# Patient Record
Sex: Female | Born: 1975 | Race: Black or African American | Hispanic: Yes | Marital: Single
Health system: Midwestern US, Community
[De-identification: ages and names within clinical notes are randomized; demographics above are authoritative.]

## PROBLEM LIST (undated history)

## (undated) DIAGNOSIS — N200 Calculus of kidney: Secondary | ICD-10-CM

## (undated) DIAGNOSIS — M48061 Spinal stenosis, lumbar region without neurogenic claudication: Secondary | ICD-10-CM

## (undated) DIAGNOSIS — M48062 Spinal stenosis, lumbar region with neurogenic claudication: Secondary | ICD-10-CM

## (undated) DIAGNOSIS — D251 Intramural leiomyoma of uterus: Secondary | ICD-10-CM

## (undated) DIAGNOSIS — N920 Excessive and frequent menstruation with regular cycle: Secondary | ICD-10-CM

## (undated) DIAGNOSIS — K219 Gastro-esophageal reflux disease without esophagitis: Secondary | ICD-10-CM

## (undated) DIAGNOSIS — E7800 Pure hypercholesterolemia, unspecified: Secondary | ICD-10-CM

## (undated) DIAGNOSIS — F418 Other specified anxiety disorders: Secondary | ICD-10-CM

## (undated) DIAGNOSIS — G4700 Insomnia, unspecified: Secondary | ICD-10-CM

## (undated) DIAGNOSIS — Z5189 Encounter for other specified aftercare: Secondary | ICD-10-CM

## (undated) DIAGNOSIS — J45909 Unspecified asthma, uncomplicated: Secondary | ICD-10-CM

## (undated) HISTORY — PX: BARIATRIC SURGERY: SHX1103

## (undated) HISTORY — DX: Gastro-esophageal reflux disease without esophagitis: K21.9

## (undated) HISTORY — PX: ABDOMINAL HYSTERECTOMY: SHX81

## (undated) HISTORY — PX: SPINE SURGERY: SHX786

## (undated) HISTORY — DX: Unspecified asthma, uncomplicated: J45.909

## (undated) HISTORY — DX: Other specified anxiety disorders: F41.8

## (undated) HISTORY — DX: Pure hypercholesterolemia, unspecified: E78.00

## (undated) HISTORY — DX: Encounter for other specified aftercare: Z51.89

## (undated) HISTORY — PX: HIP RESECTION ARTHROPLASTY: SHX1759

## (undated) HISTORY — DX: Insomnia, unspecified: G47.00

## (undated) HISTORY — PX: WISDOM TOOTH EXTRACTION: SHX21

## (undated) HISTORY — PX: TUBAL LIGATION: SHX77

---

## 2009-06-17 NOTE — Procedures (Signed)
Test Reason : PRE OP   Blood Pressure : ***/*** mmHG   Vent. Rate : 074 BPM     Atrial Rate : 074 BPM   P-R Int : 130 ms          QRS Dur : 080 ms   QT Int : 376 ms       P-R-T Axes : 032 070 041 degrees   QTc Int : 417 ms   Normal sinus rhythm   Nonspecific T wave abnormality   No previous ECGs available   Confirmed by Sherryll Burger, M.D., Sanjay ([id number]) on 06/18/2009 8:37:34 AM   Referred By:             Overread By: Delorise Shiner, M.D.

## 2009-06-17 NOTE — Procedures (Signed)
Test Reason : PRE OP   Blood Pressure : ***/*** mmHG   Vent. Rate : 074 BPM     Atrial Rate : 074 BPM   P-R Int : 130 ms          QRS Dur : 080 ms   QT Int : 376 ms       P-R-T Axes : 032 070 041 degrees   QTc Int : 417 ms   Normal sinus rhythm   Nonspecific T wave abnormality   No previous ECGs available   Confirmed by Shah, M.D., Sanjay ([id number]) on 06/18/2009 8:37:34 AM   Referred By:             Overread By: Sanjay Shah, M.D.

## 2009-06-19 NOTE — Op Note (Signed)
[facility]                                OPERATION REPORT                          SURGEON:  Arita Miss, M.D.   NAMEPennie Banter, Meriel Pica           SEX:          F   DATE:      06/19/2009                      DOB:          10-23-1976   MR#        [id number]                        ROOM:         [id number]   ACCT#      [id number]   cc:    Arita Miss, M.D.   PREOPERATIVE DIAGNOSIS   Disk herniations with stenosis, L4-5, L5-S1.   POSTOPERATIVE DIAGNOSIS   Disk herniations with stenosis, L4-5, L5-S1.   OPERATIONS PERFORMED   1. Lumbar laminectomy and decompression, L4-5, L5-S1.   2. Posterior spinal fusion, L4-5, L5-S1.   3. Use of Globus Revere pedicle screw instrumentation system, L4-5, L5-S1.   4. Transforaminal lumbar interbody fusion, L5-S1.   5. Use of Globus TLIF cage, size 10 mm x 26 mm, L5-S1.   6. Local autograft.   7. Allograft, nonstructural (INFUSE bone morphogenetic protein with       OssiMend allograft).   SURGEON   Lyna Poser, M.D.   ASSISTANT   Fredderick Phenix, PA-C   ANESTHESIA   General endotracheal   BLOOD LOSS   400 cc   FLUIDS RECEIVED   Crystalloid   DRAINS   One suction drain   COMPLICATIONS   None   HISTORY   This patient is a 33 year old female who presented to me with debilitating   back and lower extremity discomfort.  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  The surgical and   nonsurgical options were discussed with her.  The nonsurgical options   failed to alleviate her symptoms.  The decision was made to proceed with   surgery.   DESCRIPTION OF PROCEDURE   On the surgical date, the patient was identified properly in the SAU area.   The surgical procedure was further discussed with the patient and the   decision made to proceed.  IV antibiotics were administered.  The patient's   surgical site was marked appropriately.   Upon arrival in the operating room, a general anesthetic was performed.    The patient was placed prone on the operating table.  A Foley catheter was   used.  The Wilson frame was used.  The back was prepped and draped with   Betadine.   A longitudinal incision was made in the midline.  Soft tissues were   dissected down to the posterior aspect of the lumbar spine.  The deep   fascia was split longitudinally to reveal the posterolateral elements from   for L4-S1.   A lateral radiograph was taken to document our positioning.  Using a   combination of Leksell rongeurs,  curettes, pituitary rongeurs, etc., a full   central, lateral recess and foraminal decompression was performed.  This   was done from the pedicles of L4 down to and past the pedicles of S1.  No   dural tears were encountered.  Attention was then directed to the fusion.   The posterolateral elements at L4-5 and L5-S1 were fully decorticated.   This was done with a powered bur and a Leksell.  This was done along the   tips of the transverse processes at L4 bilaterally and L5 bilaterally as   well as along the sacral wings bilaterally.  In addition, the L4-5 and   L5-S1 facet joints bilaterally were decorticated with a Leksell and a   powered bur.   Attention was then directed to placement of pedicle screw instrumentation.   Using the Globus Revere system, 6.5 mm diameter screws were placed in L4   bilaterally and L5 bilaterally with a 7.5 mm diameter screws placed in the   sacrum bilaterally.  Biplane fluoroscopy was used and all screws deemed   appropriately placed.  This was done with a powered bur, a gearshift, a   handheld ball-tip probe, etc.  Attention was then directed to placement of   the TLIF cage.  On the patient's right side, distraction was obtained   across the interspace via distracting the L5-S1 pedicle screws.  A   rectangular annulotomy was performed.  The entirety of the nucleus pulposus   was removed with straight and upbiting pituitary rongeurs.  Curettes were    used to further denude the cartilaginous endplates at L5-S1.   Through this right posterolateral annulotomy, a 10 mm x 26 mm Globus TLIF   cage was placed and packed with INFUSE bone morphogenetic protein, etc.   Once it was positioned in place, the inserter device was removed and the   neurological elements irrigated.  No dural tears were encountered.   Two 65 mm rods were placed along with 6 locking caps.  The locking caps   were torqued shut.  Final films were obtained.   INFUSE bone morphogenetic protein and local autograft were placed in the   posterolateral gutters.  The wound was then closed in layers after a   suction drain was placed.  Staples were used for the skin.  A sterile   dressing was applied.   The patient was subsequently taken to the recovery room in stable condition   without complications.  All counts were correct.   ______________________________   Arita Miss, M.D.   Dictated By:   Seleta Rhymes  D:  06/19/2009  T:  06/20/2009  4:10 P  [id number]

## 2009-06-19 NOTE — Op Note (Signed)
[facility]                                OPERATION REPORT                          SURGEON:  Arita Miss, M.D.   NAMEPennie Harvey, Jillian Harvey           SEX:          F   DATE:      06/19/2009                      DOB:          Dec 03, 1976   MR#        [id number]                        ROOM:         [id number]   ACCT#      [id number]   cc:    Arita Miss, M.D.   PREOPERATIVE DIAGNOSIS   Disk herniations with stenosis, L4-5, L5-S1.   POSTOPERATIVE DIAGNOSIS   Disk herniations with stenosis, L4-5, L5-S1.   OPERATIONS PERFORMED   1. Lumbar laminectomy and decompression, L4-5, L5-S1.   2. Posterior spinal fusion, L4-5, L5-S1.   3. Use of Globus Revere pedicle screw instrumentation system, L4-5, L5-S1.   4. Transforaminal lumbar interbody fusion, L5-S1.   5. Use of Globus TLIF cage, size 10 mm x 26 mm, L5-S1.   6. Local autograft.   7. Allograft, nonstructural (INFUSE bone morphogenetic protein with       OssiMend allograft).   SURGEON   Lyna Poser, M.D.   ASSISTANT   Fredderick Phenix, PA-C   ANESTHESIA   General endotracheal   BLOOD LOSS   400 cc   FLUIDS RECEIVED   Crystalloid   DRAINS   One suction drain   COMPLICATIONS   None   HISTORY   This patient is a 33 year old female who presented to me with debilitating   back and lower extremity discomfort.  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  The surgical and   nonsurgical options were discussed with her.  The nonsurgical options   failed to alleviate her symptoms.  The decision was made to proceed with   surgery.   DESCRIPTION OF PROCEDURE   On the surgical date, the patient was identified properly in the SAU area.   The surgical procedure was further discussed with the patient and the   decision made to proceed.  IV antibiotics were administered.  The patient's   surgical site was marked appropriately.   Upon arrival in the operating room, a general anesthetic was performed.   The patient was placed prone on  the operating table.  A Foley catheter was   used.  The Wilson frame was used.  The back was prepped and draped with   Betadine.   A longitudinal incision was made in the midline.  Soft tissues were   dissected down to the posterior aspect of the lumbar spine.  The deep   fascia was split longitudinally to reveal the posterolateral elements from   for L4-S1.   A lateral radiograph was taken to document our positioning.  Using a   combination of Leksell rongeurs,  curettes, pituitary rongeurs, etc., a full   central, lateral recess and foraminal decompression was performed.  This   was done from the pedicles of L4 down to and past the pedicles of S1.  No   dural tears were encountered.  Attention was then directed to the fusion.   The posterolateral elements at L4-5 and L5-S1 were fully decorticated.   This was done with a powered bur and a Leksell.  This was done along the   tips of the transverse processes at L4 bilaterally and L5 bilaterally as   well as along the sacral wings bilaterally.  In addition, the L4-5 and   L5-S1 facet joints bilaterally were decorticated with a Leksell and a   powered bur.   Attention was then directed to placement of pedicle screw instrumentation.   Using the Globus Revere system, 6.5 mm diameter screws were placed in L4   bilaterally and L5 bilaterally with a 7.5 mm diameter screws placed in the   sacrum bilaterally.  Biplane fluoroscopy was used and all screws deemed   appropriately placed.  This was done with a powered bur, a gearshift, a   handheld ball-tip probe, etc.  Attention was then directed to placement of   the TLIF cage.  On the patient's right side, distraction was obtained   across the interspace via distracting the L5-S1 pedicle screws.  A   rectangular annulotomy was performed.  The entirety of the nucleus pulposus   was removed with straight and upbiting pituitary rongeurs.  Curettes were   used to further denude the cartilaginous endplates at L5-S1.   Through this  right posterolateral annulotomy, a 10 mm x 26 mm Globus TLIF   cage was placed and packed with INFUSE bone morphogenetic protein, etc.   Once it was positioned in place, the inserter device was removed and the   neurological elements irrigated.  No dural tears were encountered.   Two 65 mm rods were placed along with 6 locking caps.  The locking caps   were torqued shut.  Final films were obtained.   INFUSE bone morphogenetic protein and local autograft were placed in the   posterolateral gutters.  The wound was then closed in layers after a   suction drain was placed.  Staples were used for the skin.  A sterile   dressing was applied.   The patient was subsequently taken to the recovery room in stable condition   without complications.  All counts were correct.   ______________________________   Arita Miss, M.D.   Dictated By:   Seleta Rhymes  D:  06/19/2009  T:  06/20/2009  4:10 P  [id number]

## 2009-06-22 NOTE — Discharge Summary (Signed)
[facility]                                DISCHARGE SUMMARY                               DAVID Lenore Manner, M.D.   PRELIMINARY (DRAFT) -- FINAL REPORT  in [facility]   NAME:  TREJOS, Jillian Harvey             ADMIT DATE:       06/19/2009   MR#    [id number]                          [facility] DATE:       06/22/2009   ACCT#  [id number]                         SEX:              Ollen Barges, M.D.                       DOB:              1976/08/16   cc:    Arita Miss, M.D.   REASON FOR ADMISSION   Low back pain with sciatica.   PROCEDURE PERFORMED DURING ADMISSION   Lumbar laminectomy, decompression and fusion L4-S1.   HISTORY AND HOSPITAL COURSE   This patient is a 33 year old female who presented to me with debilitating   back and lower extremity discomfort. Imaging studies confirmed lumbar   stenosis at the L4-5 and L5-S1 levels.  Surgical and nonsurgical options   were discussed.  The patient agreed to proceed with surgery.   HOSPITAL COURSE   The patient was admitted through the same-day surgery program on the June 19, 2009.  The above procedure was performed without difficulty.  Over the   next 72 hours, the patient began to tolerate regular diet, oral analgesia,   and was able to ambulate with minimal assistance using a walker.   DISCHARGE INSTRUCTIONS   The patient will maintain routine wound care subsequent to discharge.  Home   PT and home RN services have been set up for her.  She will follow up with   me in 4 weeks.   __________________________   Arita Miss, M.D.   Dictated By:   [email or web address]  D:  10/08/2009  T:  10/10/2009 10:47 P   [id number]

## 2009-09-03 LAB — URINALYSIS W/ RFLX MICROSCOPIC
Bilirubin: NEGATIVE
Glucose: NEGATIVE MG/DL
Ketone: NEGATIVE MG/DL
Nitrites: POSITIVE — AB
Protein: NEGATIVE MG/DL
Specific gravity: >1.03 — ABNORMAL HIGH (ref 1.003–1.030)
Urobilinogen: 0.2 EU/DL (ref 0.2–1.0)
pH (UA): 5.5 (ref 5.0–8.0)

## 2009-09-03 LAB — URINE MICROSCOPIC ONLY
RBC: 0 /HPF (ref 0–5)
WBC: 11 /HPF (ref 0–4)

## 2009-09-03 LAB — WET PREP

## 2009-09-03 LAB — HCG URINE, QL: HCG urine, QL: NEGATIVE

## 2009-09-05 LAB — CHLAMYDIA/GC PCR
Chlamydia amplified: NEGATIVE
N. gonorrhea, amplified: NEGATIVE

## 2014-10-24 ENCOUNTER — Ambulatory Visit
Admit: 2014-10-24 | Discharge: 2014-10-24 | Payer: PRIVATE HEALTH INSURANCE | Attending: Orthopaedic Surgery | Primary: Case Management

## 2014-10-24 ENCOUNTER — Encounter: Admit: 2014-10-24 | Primary: Case Management

## 2014-10-24 ENCOUNTER — Encounter: Admit: 2014-10-24 | Discharge: 2014-10-24 | Payer: PRIVATE HEALTH INSURANCE | Primary: Case Management

## 2014-10-24 DIAGNOSIS — M25511 Pain in right shoulder: Secondary | ICD-10-CM

## 2014-10-24 MED ORDER — MELOXICAM 15 MG TAB
15 mg | ORAL_TABLET | Freq: Every day | ORAL | Status: DC
Start: 2014-10-24 — End: 2014-11-20

## 2014-10-24 NOTE — Progress Notes (Addendum)
Jillian Harvey  May 31, 1976   Chief Complaint   Patient presents with   ??? Shoulder Pain     RIGHT        HISTORY OF PRESENT ILLNESS  Jillian Harvey is a 38 y.o. female who presents today for evaluation of 38 year old female who presented today for evaluation of her right shoulder pain that started about three weeks ago.  She gave a history of an injury while she was lifting her luggage over her head while traveling and that she started to feel the pain over time and progressively get worse.  It does affect her sleep at night and overhead activities.  She has difficulty lifting her arm above her head.  She does take Motrin and Tramadol 50 mg for pain relief, which it does give relief for a few hours and then after which the pain starts again.  She does have pain at rest and at nighttime.  It is aching in nature and associated with radiation down to the mid-arm level and numbness over the forearm and the dorsum of the hand.  She denies any history of neck pain, elbow, or hand pain.  She has no previous history of pain in her shoulder, no physical therapy or previous injection to the shoulder.     .     Allergies   Allergen Reactions   ??? Pcn [Penicillins] Swelling     THROAT SWELLING        Past Medical History   Diagnosis Date   ??? Depression       History     Social History   ??? Marital Status: SINGLE     Spouse Name: N/A     Number of Children: N/A   ??? Years of Education: N/A     Occupational History   ??? Not on file.     Social History Main Topics   ??? Smoking status: Never Smoker    ??? Smokeless tobacco: Never Used   ??? Alcohol Use: No   ??? Drug Use: No   ??? Sexual Activity: Not on file     Other Topics Concern   ??? Not on file     Social History Narrative   ??? No narrative on file      Past Surgical History   Procedure Laterality Date   ??? Hx tubal ligation  1996   ??? Hx cesarean section  1991 1993   ??? Hx back surgery  2010     LS FUSION      History reviewed. No pertinent family history.    Current Outpatient Prescriptions   Medication Sig   ??? ABILIFY 10 mg tablet    ??? buPROPion XL (WELLBUTRIN XL) 150 mg tablet    ??? escitalopram oxalate (LEXAPRO) 20 mg tablet    ??? traMADol (ULTRAM) 50 mg tablet    ??? meloxicam (MOBIC) 15 mg tablet Take 1 Tab by mouth daily for 30 days. TAKE WITH FOOD     No current facility-administered medications for this visit.       REVIEW OF SYSTEM   Review of Systems   Constitutional: Negative.    HENT: Negative.    Eyes: Negative.    Respiratory: Negative.    Cardiovascular: Negative.    Gastrointestinal: Negative.    Musculoskeletal: Positive for myalgias and joint pain. Negative for back pain, falls and neck pain.   Skin: Negative.    Neurological: Positive for tingling (right forarem and hand ).   Psychiatric/Behavioral: Negative.  PHYSICAL EXAM:   BP 133/75 mmHg   Pulse 113   Ht 5\' 4"  (1.626 m)   Wt 217 lb (98.431 kg)   BMI 37.23 kg/m2   LMP 10/04/2014  Physical Exam   Constitutional: She is oriented to person, place, and time and well-developed, well-nourished, and in no distress.   Neck: Normal range of motion.   Musculoskeletal: She exhibits edema and tenderness.        Right shoulder: She exhibits decreased range of motion, tenderness, bony tenderness (AC joint and subacromian area), swelling, pain and decreased strength. She exhibits no effusion, no crepitus, no deformity, no laceration and normal pulse.   impengment sign is positive, lift off test creat discomfort, empty can test is pain full    Neurological: She is alert and oriented to person, place, and time. Gait normal.   Skin: Skin is warm and dry.   Vitals reviewed.        RADIOGRAPHS/X-RAYS:   Ac arthritis mild to moderate maintain subacromian space, normal glenohumeral joint       PLAN:   1. Shoulder pain, acute, right  I diagnosed this patient with right shoulder bursitis with tendinitis involving the subacromial bursa and impingement secondary to the bursitis.   I did educate the patient about the diagnosis.  We are going to start with conservative management that will involve anti-inflammatory course with physical therapy and then we will re-evaluate the patient in the future if she is not improving.  I did explain to the patient that it is very unlikely that there is a tear in the tendon at this point given the clinical examination.  However, if the pain is not improved over the next six to eight weeks, I think she will need to be re-evaluated again for consideration of having an MRI to rule out any tear of the tendon, specifically, the supraspinatus tendon.        - ABILIFY 10 mg tablet; ; Refill: 2  - buPROPion XL (WELLBUTRIN XL) 150 mg tablet; ; Refill: 2  - escitalopram oxalate (LEXAPRO) 20 mg tablet; ; Refill: 2  - traMADol (ULTRAM) 50 mg tablet; ; Refill: 0  - XR SHOULDER RT AP/LAT MIN 2 V; Future  - meloxicam (MOBIC) 15 mg tablet; Take 1 Tab by mouth daily for 30 days. TAKE WITH FOOD  Dispense: 1 Tab; Refill: 0    2. Bursitis, shoulder, right    - ABILIFY 10 mg tablet; ; Refill: 2  - buPROPion XL (WELLBUTRIN XL) 150 mg tablet; ; Refill: 2  - escitalopram oxalate (LEXAPRO) 20 mg tablet; ; Refill: 2  - traMADol (ULTRAM) 50 mg tablet; ; Refill: 0  - XR SHOULDER RT AP/LAT MIN 2 V; Future  - meloxicam (MOBIC) 15 mg tablet; Take 1 Tab by mouth daily for 30 days. TAKE WITH FOOD  Dispense: 1 Tab; Refill: 0    I have discussed the diagnosis with the patient and the intended plan as seen in the above orders. The patient has received an after-visit summary and questions were answered concerning future plans. I have Explained the treatment plan or any medication side effects with the patient. I have reviewed the plan of care with the patient, accepted their input and they are in agreement with the treatment goals.       Follow-up Disposition: Not on File      Glendale Chard, MD   [facility]

## 2014-10-24 NOTE — Patient Instructions (Addendum)
Shoulder Bursitis: After Your Visit  Your Care Instructions     Bursitis is inflammation of the bursa. A bursa is a small sac of fluid that cushions a joint and helps it move easily. A bursa sits under the highest point of your shoulder. You can get bursitis by overusing your shoulder, which can happen with activities such as lifting, pitching a ball, or painting. Symptoms of bursitis include pain when you move your arm. Your arm may hurt when you try to lift it, and the pain can reach down the side of your arm. You may have trouble sleeping because of the pain.  Bursitis usually gets better if you avoid the activity that caused it. If pain lasts or gets worse despite home treatment, your doctor may draw fluid from the bursa through a needle. This may relieve your pain and help your doctor know if you have an infection. If so, your doctor will prescribe antibiotics. If you have inflammation only, you may get a corticosteroid shot to reduce swelling and pain. Sometimes surgery is needed to drain or remove the bursa.  Follow-up care is a key part of your treatment and safety. Be sure to make and go to all appointments, and call your doctor if you are having problems. It???s also a good idea to know your test results and keep a list of the medicines you take.  How can you care for yourself at home?  ?? Put ice or a cold pack on your shoulder for 10 to 20 minutes at a time. Put a thin cloth between the ice and your skin.  ?? After 3 days of using ice, use heat on your shoulder. You can use a hot water bottle, a heating pad set on low, or a warm, moist towel. Some doctors suggest alternating between hot and cold.  ?? Rest your shoulder. Stop any activities that cause pain. Switch to activities that do not stress your shoulder.  ?? Take your medicines exactly as prescribed. Call your doctor if you think you are having a problem with your medicine.  ?? If your doctor recommends it, take anti-inflammatory medicines to reduce  pain. These include ibuprofen (Advil, Motrin) and naproxen (Aleve). Read and follow all instructions on the label.  ?? To prevent stiffness, gently move the shoulder joint through its full range of motion. As the pain gets better, keep doing range-of-motion exercises. Ask your doctor for exercises that will make the muscles around the shoulder joint stronger. Do these as directed.  ?? You can slowly return to the activity that caused the pain, but do it with less effort until you can do it without pain or swelling. Be sure to warm up before and stretch after you do the activity.  When should you call for help?  Call your doctor now or seek immediate medical care if:  ?? You have a fever.  ?? You have increased swelling or redness in your shoulder.  ?? You cannot use your shoulder, or the pain in your shoulder gets worse.  Watch closely for changes in your health, and be sure to contact your doctor if:  ?? You have pain for 2 weeks or longer despite home treatment.   Where can you learn more?   Go to http://www.healthwise.net/[facility]  Enter M955 in the search box to learn more about "Shoulder Bursitis: After Your Visit."   ?? 2006-2015 Healthwise, Incorporated. Care instructions adapted under license by [place] (which disclaims liability or warranty for this information).   This care instruction is for use with your licensed healthcare professional. If you have questions about a medical condition or this instruction, always ask your healthcare professional. [place] any warranty or liability for your use of this information.  Content Version: 10.5.422740; Current as of: November 14, 201          ATTEND PHYSICAL THERAPY AS INSTRUCTED  RETURN IN 4 WEEKS FOR RE EVALUATION  DO NOT TAKE MOBIC WITH OTHER NSAIDS    Nonsteroidal Anti-Inflammatory Drugs (NSAIDs): After Your Visit  Your Care Instructions  Nonsteroidal anti-inflammatory drugs (NSAIDs) relieve pain and fever. You  also can use them to reduce swelling and inflammation.  Over-the-counter NSAIDs include:  ?? Ibuprofen (Advil, Motrin).  ?? Naproxen (Aleve).  Aspirin (Bayer, Bufferin) is also an NSAID. But it doesn't work the same way as these other NSAIDs.  Prescription NSAIDs include:  ?? Diclofenac (Voltaren).  ?? Indomethacin (Indocin).  ?? Piroxicam (Feldene).  Take NSAIDS exactly as prescribed. Call your doctor if you think you are having a problem with your medicine. If you are taking over-the-counter medicine, read and follow all instructions on the label.  Follow-up care is a key part of your treatment and safety. Be sure to make and go to all appointments, and call your doctor if you are having problems. It's also a good idea to know your test results and keep a list of the medicines you take.  What should you know about NSAIDs?  ?? Do not use an over-the-counter NSAID for longer than 10 days. Talk to your doctor first.  ?? The most common side effects from NSAIDs are stomachaches, heartburn, and nausea. Taking NSAIDs with food may help prevent these problems.  ?? Using NSAIDs may:  ?? Lead to stomach ulcers or high blood pressure.  ?? Make symptoms of heart failure worse.  ?? Raise the risk of heart attack, stroke, kidney damage, skin reactions, and bleeding in the stomach and intestines.  ?? Your risks are greater if you take NSAIDs at higher doses or for longer than the instructions say. People who are older than 25 or who have heart, stomach, or intestinal disease have a higher risk for problems.  Aspirin  Aspirin is not like other NSAIDs. It can reduce the risk of heart attack and stroke. But it also raises the risks of kidney damage, skin reactions, and bleeding in the stomach and intestines.  ?? If you use other NSAIDs a lot, aspirin may not work as well to prevent heart attack and stroke.  ?? If you take aspirin every day for your heart, talk with your doctor before you take other NSAIDs.   ?? Do not give aspirin to anyone younger than 20. It has been linked to Reye syndrome, a serious illness.  When should you call for help?  Call 911 anytime you think you may need emergency care. For example, call if:  ?? You passed out (lost consciousness).  ?? You vomit blood or what looks like coffee grounds.  ?? You pass maroon or very bloody stools.  Call your doctor now or seek immediate medical care if:  ?? Your stools are black and tarlike or have streaks of blood.  Watch closely for changes in your health, and be sure to contact your doctor if you have any problems.   Where can you learn more?   Go to [employer]  Enter A328 in the search box to learn more about "Nonsteroidal Anti-Inflammatory Drugs (NSAIDs): After Your Visit."   ??  2006-2015 Healthwise, Incorporated. Care instructions adapted under license by [place] (which disclaims liability or warranty for this information). This care instruction is for use with your licensed healthcare professional. If you have questions about a medical condition or this instruction, always ask your healthcare professional. Healthwise, Incorporated disclaims any warranty or liability for your use of this information.  Content Version: 10.5.422740; Current as of: February 16, 2014

## 2014-10-24 NOTE — Progress Notes (Signed)
RIGHT SHOULDER PAIN AFTER LIFTING A 46 POUND BAG OF LUGGAGE. SOME NUMBNESS IN HAND, NO NECK PAIN, NO XRAYS, NO SPECIALIST TREATMENT.   DIFFICULTY WITH LIFTING ARM ABOVE HEAD AND BEHIND BACK

## 2014-10-24 NOTE — Telephone Encounter (Signed)
error 

## 2014-10-30 NOTE — Progress Notes (Signed)
Xray only.

## 2014-11-20 MED ORDER — MELOXICAM 15 MG TAB
15 mg | ORAL_TABLET | ORAL | Status: DC
Start: 2014-11-20 — End: 2018-03-24

## 2018-03-03 ENCOUNTER — Inpatient Hospital Stay: Admit: 2018-03-03 | Payer: MEDICAID | Primary: Case Management

## 2018-03-03 DIAGNOSIS — D649 Anemia, unspecified: Secondary | ICD-10-CM

## 2018-03-03 MED ORDER — FERRIC CARBOXYMALTOSE 50 MG IRON/ML INTRAVENOUS SOLUTION
50 mg iron/mL | Freq: Once | INTRAVENOUS | Status: AC
Start: 2018-03-03 — End: 2018-03-03
  Administered 2018-03-03: 20:00:00 via INTRAVENOUS

## 2018-03-03 MED ORDER — SODIUM CHLORIDE 0.9 % IJ SYRG
INTRAMUSCULAR | Status: DC | PRN
Start: 2018-03-03 — End: 2018-03-07
  Administered 2018-03-03: 20:00:00 via INTRAVENOUS

## 2018-03-03 MED ORDER — SODIUM CHLORIDE 0.9 % IV
Freq: Once | INTRAVENOUS | Status: AC
Start: 2018-03-03 — End: 2018-03-03
  Administered 2018-03-03: 20:00:00 via INTRAVENOUS

## 2018-03-03 MED FILL — INJECTAFER 50 MG IRON/ML INTRAVENOUS SOLUTION: 50 mg iron/mL | INTRAVENOUS | Qty: 15

## 2018-03-03 MED FILL — SODIUM CHLORIDE 0.9 % IV: INTRAVENOUS | Qty: 1000

## 2018-03-03 MED FILL — BD POSIFLUSH NORMAL SALINE 0.9 % INJECTION SYRINGE: INTRAMUSCULAR | Qty: 40

## 2018-03-03 NOTE — Progress Notes (Signed)
[facility] OPIC Progress Note    Date: March 03, 2018    Name: Cristol Engdahl    MRN: [id number]         DOB: 07/15/1976    [facility]    Ms. Colon-Estrada was assessed and education was provided.     Ms. Colon-Estrada's vitals were reviewed and patient was observed for 5 minutes prior to treatment.   Visit Vitals  BP 115/71 (BP 1 Location: Left arm, BP Patient Position: Sitting)   Pulse 84   Temp 97 ??F (36.1 ??C)   Resp 18   SpO2 99%       Lab results were obtained and reviewed.  No results found for this or any previous visit (from the past 12 hour(s)).    Saline lock started to right antecubital vein x1 attempt using 22g catheter, line flushes briskly.     Normal Saline initiated at 25 ml/hr     Injectafer 750 mg was given IVP over 8 min.    Ms. Consuegra was observed for 30 min post infusion. No s/s of reaction occurred during this time period.    Saline lock removed. Gauze and coban applied to site.    Ms. Stampley tolerated the infusion, and had no complaints.  Patient armband removed and shredded.    Ms. Jasso was discharged from [facility] in stable condition at 1540. She is to return on 03/03/18 at 1500 for her next appointment.    Roque Lias, RN  March 03, 2018  4:34 PM

## 2018-03-10 ENCOUNTER — Inpatient Hospital Stay: Admit: 2018-03-10 | Payer: MEDICAID | Primary: Case Management

## 2018-03-10 MED ORDER — FERRIC CARBOXYMALTOSE 50 MG IRON/ML INTRAVENOUS SOLUTION
50 mg iron/mL | Freq: Once | INTRAVENOUS | Status: AC
Start: 2018-03-10 — End: 2018-03-10
  Administered 2018-03-10: 19:00:00 via INTRAVENOUS

## 2018-03-10 MED FILL — INJECTAFER 50 MG IRON/ML INTRAVENOUS SOLUTION: 50 mg iron/mL | INTRAVENOUS | Qty: 15

## 2018-03-10 NOTE — Progress Notes (Addendum)
[facility] OPIC Progress Note    Date: March 10, 2018    Name: Camdyn Laden    MRN: [id number]         DOB: [birth year]    [facility]    Ms. Colon-Estrada was assessed and education was provided.     Ms. Colon-Estrada's vitals were reviewed and patient was observed for 5 minutes prior to treatment.   Visit Vitals  BP 121/81 (BP 1 Location: Left arm, BP Patient Position: Sitting)   Pulse 79   Temp 98.3 ??F (36.8 ??C)   Resp 18   SpO2 99%       Saline lock started to right antecubital vein x1 attempt using 22g catheter, brisk blood return; line flushes well.     Injectafer 750 mg was given IVP over 13 min.    No s/s of reaction occurred during this time period.    Saline lock removed. Gauze and coban applied to site.    Ms. Doubrava tolerated the infusion, and had no complaints.  Patient armband removed and shredded.    Ms. Hunke was discharged from [facility] in stable condition at 1455. She is to followup with referring provider.    Meliton Rattan, RN  March 10, 2018  1455

## 2018-03-21 NOTE — Addendum Note (Signed)
Addended by: Maralyn Sago TED on: 03/23/2018 08:36 AM     Modules accepted: Orders

## 2018-03-24 ENCOUNTER — Encounter

## 2018-03-24 ENCOUNTER — Inpatient Hospital Stay: Admit: 2018-03-24 | Payer: MEDICAID | Primary: Case Management

## 2018-03-24 DIAGNOSIS — Z01812 Encounter for preprocedural laboratory examination: Secondary | ICD-10-CM

## 2018-03-24 LAB — CBC W/O DIFF
HCT: 38.1 % (ref 35.0–45.0)
HGB: 12 g/dL (ref 12.0–16.0)
MCH: 26.8 PG (ref 24.0–34.0)
MCHC: 31.5 g/dL (ref 31.0–37.0)
MCV: 85 FL (ref 74.0–97.0)
MPV: 8.7 FL — ABNORMAL LOW (ref 9.2–11.8)
PLATELET: 389 10*3/uL (ref 135–420)
RBC: 4.48 M/uL (ref 4.20–5.30)
RDW: 22.5 % — ABNORMAL HIGH (ref 11.6–14.5)
WBC: 9.6 10*3/uL (ref 4.6–13.2)

## 2018-03-24 LAB — BETA HCG, QT
Beta HCG, QT: <1 m[IU]/mL (ref 0–10)
hCG Quant: <1 m[IU]/mL (ref 0–10)

## 2018-03-24 NOTE — Other (Signed)
PAT - SURGICAL PRE-ADMISSION INSTRUCTIONS    NAME:  Jillian Harvey                                                          TODAY'S DATE:  03/24/2018    SURGERY DATE:  03/28/2018                                  SURGERY ARRIVAL TIME:   TBA    1. Do NOT eat or drink anything, including candy or gum, after MIDNIGHT on 03/27/18 , unless you have specific instructions from your Surgeon or Anesthesia Provider to do so.  2. No smoking on the day of surgery.  3. No alcohol 24 hours prior to the day of surgery.  4. No recreational drugs for one week prior to the day of surgery.  5. Leave all valuables, including money/purse, at home.  6. Remove all jewelry, nail polish, makeup (including mascara); no lotions, powders, deodorant, or perfume/cologne/after shave.  7. Glasses/Contact lenses and Dentures may be worn to the hospital.  They will be removed prior to surgery.  8. Call your doctor if symptoms of a cold or illness develop within 24 ours prior to surgery.  9. AN ADULT MUST DRIVE YOU HOME AFTER OUTPATIENT SURGERY.   10. If you are having an OUTPATIENT procedure, please make arrangements for a responsible adult to be with you for 24 hours after your surgery.  11. If you are admitted to the hospital, you will be assigned to a bed after surgery is complete.  Normally a family member will not be able to see you until you are in your assigned bed.  54. Family is encouraged to accompany you to the hospital.  We ask visitors in the treatment area to be limited to ONE person at a time to ensure patient privacy.  EXCEPTIONS WILL BE MADE AS NEEDED.  62. Children under 12 are discouraged from entering the treatment area and need to be supervised by an adult when in the waiting room.    Special Instructions:    Bring inhaler., Take these medications the morning of surgery with a sip of water:  [place] IF DESIRED    Patient Prep:    shower with anti-bacterial soap     These surgical instructions were reviewed with PATIENT during the PAT PHONE CALL.     Directions:  On the morning of surgery, please go to the [facility].  Enter the building from the [employer] lot entrance, 1st floor (next to the Emergency Room entrance).  Take the elevator to the 2nd floor.  Sign in at the Registration Desk.    If you have any questions and/or concerns, please do not hesitate to call:  (Prior to the day of surgery)  [facility] unit:  [phone]  (Day of surgery)  [facility] unit:  [phone]

## 2018-03-24 NOTE — Unmapped (Signed)
Formatting of this note might be different from the original.  PAT - SURGICAL PRE-ADMISSION INSTRUCTIONS    NAME:  Jillian Harvey                                                          TODAY'S DATE:  03/24/2018    SURGERY DATE:  03/28/2018                                  SURGERY ARRIVAL TIME:   TBA    1. Do NOT eat or drink anything, including candy or gum, after MIDNIGHT on 03/27/18 , unless you have specific instructions from your Surgeon or Anesthesia Provider to do so.  2. No smoking on the day of surgery.  3. No alcohol 24 hours prior to the day of surgery.  4. No recreational drugs for one week prior to the day of surgery.  5. Leave all valuables, including money/purse, at home.  6. Remove all jewelry, nail polish, makeup (including mascara); no lotions, powders, deodorant, or perfume/cologne/after shave.  7. Glasses/Contact lenses and Dentures may be worn to the hospital.  They will be removed prior to surgery.  8. Call your doctor if symptoms of a cold or illness develop within 24 ours prior to surgery.  9. AN ADULT MUST DRIVE YOU HOME AFTER OUTPATIENT SURGERY.   10. If you are having an OUTPATIENT procedure, please make arrangements for a responsible adult to be with you for 24 hours after your surgery.  11. If you are admitted to the hospital, you will be assigned to a bed after surgery is complete.  Normally a family member will not be able to see you until you are in your assigned bed.  57. Family is encouraged to accompany you to the hospital.  We ask visitors in the treatment area to be limited to ONE person at a time to ensure patient privacy.  EXCEPTIONS WILL BE MADE AS NEEDED.  40. Children under 12 are discouraged from entering the treatment area and need to be supervised by an adult when in the waiting room.    Special Instructions:    Bring inhaler., Take these medications the morning of surgery with a sip of water:  [place] IF DESIRED    Patient Prep:    shower with  anti-bacterial soap    These surgical instructions were reviewed with PATIENT during the PAT PHONE CALL.     Directions:  On the morning of surgery, please go to the [facility].  Enter the building from the [employer] lot entrance, 1st floor (next to the Emergency Room entrance).  Take the elevator to the 2nd floor.  Sign in at the Registration Desk.    If you have any questions and/or concerns, please do not hesitate to call:  (Prior to the day of surgery)  [facility] unit:  [phone]  (Day of surgery)  [facility] unit:  [phone]  Electronically signed by Westley Hummer, RN at 03/24/2018 11:19 AM EDT

## 2018-03-25 LAB — TYPE & SCREEN
ABO/Rh(D): O NEG
Antibody screen: NEGATIVE

## 2018-03-25 LAB — TYPE AND SCREEN
ABO/Rh: O NEG
Antibody Screen: NEGATIVE

## 2018-03-28 ENCOUNTER — Inpatient Hospital Stay: Payer: MEDICAID

## 2018-03-28 LAB — HCG URINE, QL. - POC: Pregnancy test,urine (POC): NEGATIVE

## 2018-03-28 MED ORDER — LACTATED RINGERS IV
INTRAVENOUS | Status: AC
Start: 2018-03-28 — End: 2018-03-29
  Administered 2018-03-28: 11:00:00 via INTRAVENOUS

## 2018-03-28 MED ORDER — INSULIN LISPRO 100 UNIT/ML INJECTION
100 unit/mL | Freq: Once | SUBCUTANEOUS | Status: AC
Start: 2018-03-28 — End: 2018-03-28

## 2018-03-28 MED ORDER — SODIUM CHLORIDE 0.9 % IJ SYRG
Freq: Three times a day (TID) | INTRAMUSCULAR | Status: DC
Start: 2018-03-28 — End: 2018-03-29
  Administered 2018-03-28: 18:00:00 via INTRAVENOUS

## 2018-03-28 MED ORDER — LACTATED RINGERS IV
INTRAVENOUS | Status: DC
Start: 2018-03-28 — End: 2018-03-28

## 2018-03-28 MED ORDER — VECURONIUM BROMIDE 10 MG IV SOLR
10 mg | INTRAVENOUS | Status: AC
Start: 2018-03-28 — End: ?

## 2018-03-28 MED ORDER — VECURONIUM BROMIDE 10 MG IV SOLR
10 mg | INTRAVENOUS | Status: DC | PRN
Start: 2018-03-28 — End: 2018-03-28
  Administered 2018-03-28 (×3): via INTRAVENOUS

## 2018-03-28 MED ORDER — LIDOCAINE (PF) 20 MG/ML (2 %) IJ SOLN
20 mg/mL (2 %) | INTRAMUSCULAR | Status: AC
Start: 2018-03-28 — End: ?

## 2018-03-28 MED ORDER — HYDROCODONE-ACETAMINOPHEN 5 MG-325 MG TAB
5-325 mg | ORAL | Status: DC | PRN
Start: 2018-03-28 — End: 2018-03-29
  Administered 2018-03-28 – 2018-03-29 (×2): via ORAL

## 2018-03-28 MED ORDER — ACETAMINOPHEN 325 MG TABLET
325 mg | ORAL | Status: DC | PRN
Start: 2018-03-28 — End: 2018-03-29
  Administered 2018-03-29: 04:00:00 via ORAL

## 2018-03-28 MED ORDER — LEVOFLOXACIN IN D5W 500 MG/100 ML IV PIGGY BACK
500 mg/100 mL | Freq: Once | INTRAVENOUS | Status: AC
Start: 2018-03-28 — End: 2018-03-28

## 2018-03-28 MED ORDER — INSULIN LISPRO 100 UNIT/ML INJECTION
100 unit/mL | Freq: Four times a day (QID) | SUBCUTANEOUS | Status: DC
Start: 2018-03-28 — End: 2018-03-28

## 2018-03-28 MED ORDER — SODIUM CHLORIDE 0.9 % IJ SYRG
INTRAMUSCULAR | Status: DC | PRN
Start: 2018-03-28 — End: 2018-03-29

## 2018-03-28 MED ORDER — WATER FOR INJECTION, STERILE INJECTION
INTRAMUSCULAR | Status: AC
Start: 2018-03-28 — End: ?

## 2018-03-28 MED ORDER — ZOLPIDEM 5 MG TAB
5 mg | Freq: Every evening | ORAL | Status: DC | PRN
Start: 2018-03-28 — End: 2018-03-29

## 2018-03-28 MED ORDER — LACTATED RINGERS IV
INTRAVENOUS | Status: DC
Start: 2018-03-28 — End: 2018-03-29
  Administered 2018-03-28 – 2018-03-29 (×2): via INTRAVENOUS

## 2018-03-28 MED ORDER — SODIUM CHLORIDE 0.9 % IV
40 mg/mL | INTRAVENOUS | Status: DC | PRN
Start: 2018-03-28 — End: 2018-03-28
  Administered 2018-03-28: 13:00:00

## 2018-03-28 MED ORDER — DIPHENHYDRAMINE HCL 50 MG/ML IJ SOLN
50 mg/mL | INTRAMUSCULAR | Status: DC | PRN
Start: 2018-03-28 — End: 2018-03-28

## 2018-03-28 MED ORDER — OXYCODONE 5 MG TAB
5 mg | Freq: Once | ORAL | Status: AC | PRN
Start: 2018-03-28 — End: 2018-03-28
  Administered 2018-03-28: 16:00:00 via ORAL

## 2018-03-28 MED ORDER — SODIUM CHLORIDE 0.9 % IV
INTRAVENOUS | Status: DC | PRN
Start: 2018-03-28 — End: 2018-03-28
  Administered 2018-03-28: 13:00:00

## 2018-03-28 MED ORDER — ONDANSETRON (PF) 4 MG/2 ML INJECTION
4 mg/2 mL | Freq: Once | INTRAMUSCULAR | Status: DC
Start: 2018-03-28 — End: 2018-03-28

## 2018-03-28 MED ORDER — BUPIVACAINE-EPINEPHRINE (PF) 0.5 %-1:200,000 IJ SOLN
0.5 %-1:200,000 | INTRAMUSCULAR | Status: AC
Start: 2018-03-28 — End: ?

## 2018-03-28 MED ORDER — PROPOFOL 10 MG/ML IV EMUL
10 mg/mL | INTRAVENOUS | Status: AC
Start: 2018-03-28 — End: ?

## 2018-03-28 MED ORDER — FENTANYL CITRATE (PF) 50 MCG/ML IJ SOLN
50 mcg/mL | INTRAMUSCULAR | Status: DC | PRN
Start: 2018-03-28 — End: 2018-03-28
  Administered 2018-03-28 (×6): via INTRAVENOUS

## 2018-03-28 MED ORDER — MIDAZOLAM 1 MG/ML IJ SOLN
1 mg/mL | INTRAMUSCULAR | Status: DC | PRN
Start: 2018-03-28 — End: 2018-03-28
  Administered 2018-03-28: 12:00:00 via INTRAVENOUS

## 2018-03-28 MED ORDER — FENTANYL CITRATE (PF) 50 MCG/ML IJ SOLN
50 mcg/mL | INTRAMUSCULAR | Status: DC | PRN
Start: 2018-03-28 — End: 2018-03-28
  Administered 2018-03-28: 15:00:00 via INTRAVENOUS

## 2018-03-28 MED ORDER — PROPOFOL 10 MG/ML IV EMUL
10 mg/mL | INTRAVENOUS | Status: DC | PRN
Start: 2018-03-28 — End: 2018-03-28
  Administered 2018-03-28: 12:00:00 via INTRAVENOUS

## 2018-03-28 MED ORDER — FENTANYL CITRATE (PF) 50 MCG/ML IJ SOLN
50 mcg/mL | INTRAMUSCULAR | Status: AC
Start: 2018-03-28 — End: ?

## 2018-03-28 MED ORDER — NEOSTIGMINE METHYLSULFATE 3 MG/3 ML (1 MG/ML) IV SYRINGE
3 mg/ mL (1 mg/mL) | INTRAVENOUS | Status: AC
Start: 2018-03-28 — End: ?

## 2018-03-28 MED ORDER — GLYCOPYRROLATE 0.2 MG/ML IJ SOLN
0.2 mg/mL | INTRAMUSCULAR | Status: DC | PRN
Start: 2018-03-28 — End: 2018-03-28
  Administered 2018-03-28: 15:00:00 via INTRAVENOUS

## 2018-03-28 MED ORDER — LIDOCAINE (PF) 20 MG/ML (2 %) IJ SOLN
20 mg/mL (2 %) | INTRAMUSCULAR | Status: DC | PRN
Start: 2018-03-28 — End: 2018-03-28
  Administered 2018-03-28: 12:00:00 via INTRAVENOUS

## 2018-03-28 MED ORDER — CLINDAMYCIN IN D5W 900 MG/50 ML IV PIGGY BACK
900 mg/50 mL | INTRAVENOUS | Status: DC | PRN
Start: 2018-03-28 — End: 2018-03-28
  Administered 2018-03-28: 12:00:00 via INTRAVENOUS

## 2018-03-28 MED ORDER — DEXAMETHASONE SODIUM PHOSPHATE 4 MG/ML IJ SOLN
4 mg/mL | INTRAMUSCULAR | Status: DC | PRN
Start: 2018-03-28 — End: 2018-03-28
  Administered 2018-03-28: 12:00:00 via INTRAVENOUS

## 2018-03-28 MED ORDER — DOCUSATE SODIUM 100 MG CAP
100 mg | Freq: Two times a day (BID) | ORAL | Status: DC | PRN
Start: 2018-03-28 — End: 2018-03-29

## 2018-03-28 MED ORDER — GENTAMICIN 40 MG/ML IJ SOLN
40 mg/mL | INTRAMUSCULAR | Status: AC
Start: 2018-03-28 — End: ?

## 2018-03-28 MED ORDER — GLYCOPYRROLATE 0.2 MG/ML IJ SOLN
0.2 mg/mL | INTRAMUSCULAR | Status: AC
Start: 2018-03-28 — End: ?

## 2018-03-28 MED ORDER — ONDANSETRON (PF) 4 MG/2 ML INJECTION
4 mg/2 mL | INTRAMUSCULAR | Status: DC | PRN
Start: 2018-03-28 — End: 2018-03-28
  Administered 2018-03-28: 14:00:00 via INTRAVENOUS

## 2018-03-28 MED ORDER — DEXAMETHASONE SODIUM PHOSPHATE 4 MG/ML IJ SOLN
4 mg/mL | INTRAMUSCULAR | Status: AC
Start: 2018-03-28 — End: ?

## 2018-03-28 MED ORDER — NALOXONE 0.4 MG/ML INJECTION
0.4 mg/mL | INTRAMUSCULAR | Status: DC | PRN
Start: 2018-03-28 — End: 2018-03-28

## 2018-03-28 MED ORDER — SODIUM CHLORIDE 0.9 % IJ SYRG
INTRAMUSCULAR | Status: DC | PRN
Start: 2018-03-28 — End: 2018-03-28

## 2018-03-28 MED ORDER — DIPHENHYDRAMINE HCL 50 MG/ML IJ SOLN
50 mg/mL | Freq: Four times a day (QID) | INTRAMUSCULAR | Status: DC | PRN
Start: 2018-03-28 — End: 2018-03-29

## 2018-03-28 MED ORDER — BUPIVACAINE-EPINEPHRINE (PF) 0.5 %-1:200,000 IJ SOLN
0.5 %-1:200,000 | INTRAMUSCULAR | Status: DC | PRN
Start: 2018-03-28 — End: 2018-03-28
  Administered 2018-03-28 (×2): via SUBCUTANEOUS

## 2018-03-28 MED ORDER — MIDAZOLAM 1 MG/ML IJ SOLN
1 mg/mL | INTRAMUSCULAR | Status: AC
Start: 2018-03-28 — End: ?

## 2018-03-28 MED ORDER — CLINDAMYCIN IN D5W 900 MG/50 ML IV PIGGY BACK
900 mg/50 mL | Freq: Once | INTRAVENOUS | Status: AC
Start: 2018-03-28 — End: 2018-03-28
  Administered 2018-03-28: 11:00:00 via INTRAVENOUS

## 2018-03-28 MED ORDER — SUCCINYLCHOLINE CHLORIDE 100 MG/5 ML (20 MG/ML) IV SYRINGE
100 mg/5 mL (20 mg/mL) | INTRAVENOUS | Status: AC
Start: 2018-03-28 — End: ?

## 2018-03-28 MED ORDER — ONDANSETRON (PF) 4 MG/2 ML INJECTION
4 mg/2 mL | INTRAMUSCULAR | Status: DC | PRN
Start: 2018-03-28 — End: 2018-03-29

## 2018-03-28 MED ORDER — NEOSTIGMINE METHYLSULFATE 5 MG/5 ML (1 MG/ML) IV SYRINGE
5 mg/ mL (1 mg/mL) | INTRAVENOUS | Status: DC | PRN
Start: 2018-03-28 — End: 2018-03-28
  Administered 2018-03-28: 15:00:00 via INTRAVENOUS

## 2018-03-28 MED ORDER — SUCCINYLCHOLINE CHLORIDE 20 MG/ML INJECTION
20 mg/mL | INTRAMUSCULAR | Status: DC | PRN
Start: 2018-03-28 — End: 2018-03-28
  Administered 2018-03-28: 12:00:00 via INTRAVENOUS

## 2018-03-28 MED ORDER — SODIUM CHLORIDE 0.9 % IJ SYRG
Freq: Three times a day (TID) | INTRAMUSCULAR | Status: DC
Start: 2018-03-28 — End: 2018-03-28

## 2018-03-28 MED ORDER — MORPHINE 2 MG/ML INJECTION
2 mg/mL | INTRAMUSCULAR | Status: DC | PRN
Start: 2018-03-28 — End: 2018-03-29
  Administered 2018-03-28 – 2018-03-29 (×2): via INTRAVENOUS

## 2018-03-28 MED ORDER — FAMOTIDINE 20 MG TAB
20 mg | Freq: Once | ORAL | Status: AC
Start: 2018-03-28 — End: 2018-03-28
  Administered 2018-03-28: 11:00:00 via ORAL

## 2018-03-28 MED ORDER — ONDANSETRON (PF) 4 MG/2 ML INJECTION
4 mg/2 mL | INTRAMUSCULAR | Status: AC
Start: 2018-03-28 — End: ?

## 2018-03-28 MED FILL — XYLOCAINE-MPF 20 MG/ML (2 %) INJECTION SOLUTION: 20 mg/mL (2 %) | INTRAMUSCULAR | Qty: 5

## 2018-03-28 MED FILL — BD POSIFLUSH NORMAL SALINE 0.9 % INJECTION SYRINGE: INTRAMUSCULAR | Qty: 40

## 2018-03-28 MED FILL — MIDAZOLAM 1 MG/ML IJ SOLN: 1 mg/mL | INTRAMUSCULAR | Qty: 2

## 2018-03-28 MED FILL — PROPOFOL 10 MG/ML IV EMUL: 10 mg/mL | INTRAVENOUS | Qty: 20

## 2018-03-28 MED FILL — LACTATED RINGERS IV: INTRAVENOUS | Qty: 1000

## 2018-03-28 MED FILL — CLINDAMYCIN IN D5W 900 MG/50 ML IV PIGGY BACK: 900 mg/50 mL | INTRAVENOUS | Qty: 50

## 2018-03-28 MED FILL — SUCCINYLCHOLINE CHLORIDE 100 MG/5 ML (20 MG/ML) IV SYRINGE: 100 mg/5 mL (20 mg/mL) | INTRAVENOUS | Qty: 5

## 2018-03-28 MED FILL — OXYCODONE 5 MG TAB: 5 mg | ORAL | Qty: 1

## 2018-03-28 MED FILL — FENTANYL CITRATE (PF) 50 MCG/ML IJ SOLN: 50 mcg/mL | INTRAMUSCULAR | Qty: 2

## 2018-03-28 MED FILL — GLYCOPYRROLATE 0.2 MG/ML IJ SOLN: 0.2 mg/mL | INTRAMUSCULAR | Qty: 2

## 2018-03-28 MED FILL — HYDROCODONE-ACETAMINOPHEN 5 MG-325 MG TAB: 5-325 mg | ORAL | Qty: 1

## 2018-03-28 MED FILL — FAMOTIDINE 20 MG TAB: 20 mg | ORAL | Qty: 1

## 2018-03-28 MED FILL — VECURONIUM BROMIDE 10 MG IV SOLR: 10 mg | INTRAVENOUS | Qty: 10

## 2018-03-28 MED FILL — SENSORCAINE-MPF/EPINEPHRINE 0.5 %-1:200,000 INJECTION SOLUTION: 0.5 %-1:200,000 | INTRAMUSCULAR | Qty: 30

## 2018-03-28 MED FILL — DEXAMETHASONE SODIUM PHOSPHATE 4 MG/ML IJ SOLN: 4 mg/mL | INTRAMUSCULAR | Qty: 1

## 2018-03-28 MED FILL — MORPHINE 2 MG/ML INJECTION: 2 mg/mL | INTRAMUSCULAR | Qty: 1

## 2018-03-28 MED FILL — NEOSTIGMINE METHYLSULFATE 3 MG/3 ML (1 MG/ML) IV SYRINGE: 3 mg/ mL (1 mg/mL) | INTRAVENOUS | Qty: 3

## 2018-03-28 MED FILL — WATER FOR INJECTION, STERILE INJECTION: INTRAMUSCULAR | Qty: 20

## 2018-03-28 MED FILL — GENTAMICIN 40 MG/ML IJ SOLN: 40 mg/mL | INTRAMUSCULAR | Qty: 2

## 2018-03-28 MED FILL — LEVOFLOXACIN IN D5W 500 MG/100 ML IV PIGGY BACK: 500 mg/100 mL | INTRAVENOUS | Qty: 100

## 2018-03-28 MED FILL — ONDANSETRON (PF) 4 MG/2 ML INJECTION: 4 mg/2 mL | INTRAMUSCULAR | Qty: 2

## 2018-03-28 NOTE — Other (Signed)
Spoke to son [name] to update family regarding patient's surgical status.

## 2018-03-28 NOTE — Other (Signed)
Recd care of pt from OR via stretcher.  Resp even and unlabored.  Attached to monitor.  VSS.  OR, MAR and anesthesia report acknowledged.  Will cont to monitor.    1157  TRANSFER - OUT REPORT:    Verbal report given to [place], Therapist, sports (name) on Jillian Harvey  being transferred to 2200 (unit) for routine post - op       Report consisted of patient???s Situation, Background, Assessment and   Recommendations(SBAR).     Information from the following report(s) SBAR, Kardex, OR Summary, MAR and Cardiac Rhythm sinus rhythm\\ was reviewed with the receiving nurse.    Lines:   Peripheral IV 03/28/18 Left Hand (Active)   Site Assessment Clean, dry, & intact 03/28/2018 10:52 AM   Phlebitis Assessment 0 03/28/2018 10:52 AM   Infiltration Assessment 0 03/28/2018 10:52 AM   Dressing Status Clean, dry, & intact 03/28/2018 10:52 AM   Dressing Type Transparent;Tape 03/28/2018 10:52 AM   Hub Color/Line Status Pink;Infusing 03/28/2018 10:52 AM   Action Taken Open ports on tubing capped 03/28/2018 10:52 AM   Alcohol Cap Used Yes 03/28/2018 10:52 AM        Opportunity for questions and clarification was provided.      Patient transported with:   [employer]

## 2018-03-28 NOTE — Progress Notes (Signed)
a  GYN Progress note    Patient: Ardythe Klute MRN: [id number]  SSN: [id number]    Date of Birth: April 25, 1976  Age: 42 y.o.  Sex: female      POD: Day of Surgery        Admitted for Menorrhagia [N92.0] Procedures: Procedure(s):  davinci total laparoscopic hysterectomy, bilateral salpingectomy     Active Problems:    Menorrhagia (03/28/2018)         Patient Active Problem List    Diagnosis Date Noted   ??? Menorrhagia 03/28/2018   .    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 Good pain control.  Ambulating, voiding and eating well, no nausea or vomiting.     Blood pressure 113/73, pulse 87, temperature 97.4 ??F (36.3 ??C), resp. rate 14, height 5\' 4"  (1.626 m), weight 112 kg (247 lb), last menstrual period 03/07/2018, SpO2 94 %.      Temp (24hrs), Avg:97.9 ??F (36.6 ??C), Min:97.4 ??F (36.3 ??C), Max:98.2 ??F (36.8 ??C)      WBC   Date/Time Value Ref Range Status   03/24/2018 02:47 PM 9.6 4.6 - 13.2 K/uL Final     HGB   Date/Time Value Ref Range Status   03/24/2018 02:47 PM 12.0 12.0 - 16.0 Liller Yohn/dL Final     PLATELET   Date/Time Value Ref Range Status   03/24/2018 02:47 PM 389 135 - 420 K/uL Final       Per my exam and/or the nursing assessment:  Chest is clear to auscultation.  Abdomen soft not tender.  Bowel sounds are normal  Incisions are clean  Legs are normal without tenderness, swelling, or redness    Impression,  Doing well    Plan: Probable DC in am, BS not so bad needs control until then    Osvaldo Human, MD

## 2018-03-28 NOTE — Interval H&P Note (Signed)
H&P Update:  Jillian Harvey was seen and examined.  History and physical has been reviewed. The patient has been examined. There have been no significant clinical changes since the completion of the originally dated History and Physical.    Signed By: Samuella Cota     March 28, 2018 7:08 AM

## 2018-03-28 NOTE — Anesthesia Post-Procedure Evaluation (Signed)
Procedure(s):  davinci total laparoscopic hysterectomy, bilateral salpingectomy.    general    <BSHSIANPOST>    Vitals Value Taken Time   BP 133/77 03/28/2018 10:52 AM   Temp     Pulse 93 03/28/2018 10:52 AM   Resp 9 03/28/2018 10:52 AM   SpO2 100 % 03/28/2018 10:52 AM   Vitals shown include unvalidated device data.

## 2018-03-28 NOTE — Interval H&P Note (Signed)
H&P Update:  Jillian Harvey was seen and examined.  History and physical has been reviewed. The patient has been examined. There have been no significant clinical changes since the completion of the originally dated History and Physical.    Signed By: Osvaldo Human, MD     March 28, 2018 7:23 AM

## 2018-03-28 NOTE — Op Note (Signed)
Lynden Oxford, MD  [facility]  [address] [place] [address]  [phone]    Operative Note for Robotic Assisted Hysterectomy    Name: Jillian Harvey   Medical Record Number: [id number]   Date of Birth: 07-Oct-1976  Today's Date: March 28, 2018    Preop Diagnosis: menorrhagie  n92.0, d64.9 anemia  Postop Diagnosis: menorrhagie  n92.0, d64.9 anemia    Procedure(s):  davinci total laparoscopic hysterectomy, bilateral salpingectomy    Surgeon: Osvaldo Human, MD  Surgeon(s):  Osvaldo Human, MD     Lennox Solders EVMS  Assistant: Circ-1: Lucretia Roers, RN  Scrub Tech-1: Judeen Hammans  Surg Asst-1: Letta Kocher   Anesthesia: General  EBL:50  Findings: 200  gm   size uterus  Specimens:   ID Type Source Tests Collected by Time Destination   1 : UTERUS, CERVIX, AND TUBES    Osvaldo Human, MD 03/28/2018 [id number] Pathology      Complications: none  Disposition: to the recovery room in stable condition  Implants: none   Procedure:     The patient was brought to the operating room where patient identification and surgery identification were completed with the patient and the OR team. The patient was transferred to the OR table and GETA was obtained without difficulty. Both arms were padded and tucked. SCD's were on and activated. The patient was placed in the lithotomy position in adjustable Allen stirrups.  The was carefully tested in trendelenburg before being prepped and draped in the normal sterile fashion. Patient identification and surgery identification were repeated with the surgeon and the OR team.         A weighted speculum was placed vaginally and the anterior lip of the cervix was grasped with a single toothed tenaculum.The uterus was sounded.  Stay sutures of 0-Vicryl were placed.  V care of large size was placed and normal saline was used to inflate the intrauterine balloon .   All other instruments were removed. A foley catheter was placed with clear urine noted.          Attention was turned to the abdomen.  An 11 blade scaple was then used to make a 10 mm incision  above the umbilicus.  A Veress needle was introduced into the abdominal cavity with anterior tenting of the abdominal wall. Pneumoperitoneum was obtained using CO2 gas to a pressure of 15 mm of Hg. The Veress needle was removed and a 8 mm trocar port was placed without complication. The patient was placed in steep [place] and 8 mm incisions were made in the right and left lower quadrants, after .25% Marcaine with epinephrine was injected using an epidural  needle. Two 8 mm robotic trocar ports were placed under laparoscopic visualization without complication.        A separate assistant port was placed in the Right Lower Quadrant (RLQ) under direct vision.         A 8 mm non bladed trocar was then placed in the right  lower quadrant again after .25% Marcaine with epinephrine was injected using a spinal needle.  The Davinci robotic system was then brought up to the patient and docked without difficulty.  The surgeon then went to the surgeons console. The pelvis was examined with the above noted findings.        The left cornual region of the uterus was grasped and the fallopian tubes were removed by using the bipolar  sealing device. The utero-ovarian, round and broad ligaments were cauterized using the bipolar cautery and transected using the monopolar scissors. The bladder flap was created across the lower uterine segment. The uterine vessel bundle was skeletonized, cauterized with the bipolar cautery and cut with the monopolar scissors.  Attention was turned to the right cornual region where the same transections were performed. The V care cup was then elevated and the uterus retroverted by the operator's assistant.     .     A  total hysterectomy was accomplished by the following method:   An anterior colpotomy was the preformed using the monopolar scissors and  extended laterally.  The uterus was then anteverted and a posterior colpotomy preformed with the monopolar scissors and extended laterally and circumferential until the completion of the colpotomy was obtained. The uterus was then removed vaginally and the balloon bulb was inserted into the vagina  to maintain pneomoperitoneum.   The suture was introduced thorough one of the operative ports. The edges of the vaginal cuff were examined and vaginal cuff closure was then completed using V-loc 90 suture .      The pelvis was copiously irrigated with Gentamycin irrigant and all fluid was removed. Hemostasis was again noted. The ureters were identified bilaterally with normal peristalsis noted. The robot was the then undocked from the trocar and moved away from the patient.      The lower quadrant ports were removed and hemostasis was noted.The ports, as they were  8 mm and smaller were closed on the skin.        The incisions were closed with subcuticular 3-0 Monocryl suture.  The foley catheter was removed.at the close of the procedure. Sponge, lap, needle and instrument counts were correct x 2.  A post procedure pause was done to review the procedure, the findings, the specimen, blood loss, I+O as well as any concerns from the team.  The patient was transferred to the RR extubated, in stable condition.    Osvaldo Human, MD  March 28, 2018

## 2018-03-28 NOTE — Progress Notes (Signed)
2000 Patient received in bed without any complaints voiced. Patient alert & oriented x4. Call light in reach & side rails up x3.    2345 Up & about to bathroom frequently to urinate. Tolerating it well. Encourage to use ICS.

## 2018-03-28 NOTE — Other (Signed)
Pre-Op Summary    Pt arrived via car with family/friend and is oriented to time, place, person and situation. Patient with steady gait with none assistive devices.     Visit Vitals  BP 113/67 (BP 1 Location: Left arm, BP Patient Position: At rest)   Pulse 89   Temp 97.9 ??F (36.6 ??C)   Resp 16   Ht 5\' 4"  (1.626 m)   Wt 112 kg (247 lb)   LMP 03/07/2018   SpO2 98%   BMI 42.40 kg/m??       Peripheral IV located on Left hand .    Patients belongings are located with son.    Patient's point of contact is son- daniel and their contact number is: [phone]. They will be in the waiting room. They are able to receive medication information. They will be their ride home.

## 2018-03-28 NOTE — Progress Notes (Signed)
Chaplain conducted an initial consultation and Spiritual Assessment for Jillian Harvey, who is a 42 y.o.,female. Patient???s Primary Language is: [place].   According to the patient???s EMR Religious Affiliation is: No preference.     The reason the Patient came to the hospital is:   Patient Active Problem List    Diagnosis Date Noted   ??? Menorrhagia 03/28/2018        The Chaplain provided the following Interventions:  Initiated a relationship of care and support.   Provided information about Spiritual Care Services.  Offered prayer and assurance of continued prayers on patient's behalf.     The following outcomes were achieved:  Patient expressed gratitude for chaplain's visit.    Assessment:  There are no further spiritual or religious issues which require intervention at this time.     Plan:  Chaplains will continue to follow and will provide pastoral care on an as needed/requested basis.      Kennith Gain, M.Div.  Chaplain, [place]  [facility]: [phone]/[facility]: [phone]

## 2018-03-28 NOTE — Progress Notes (Signed)
Chart reviewed and pt verified demographics. She lives with her mom.    Patient has designated _mom_______________________ to participate in his/her discharge plan and to receive any needed information.     Name: Jillian Harvey  Address:same as pt  Phone [phone]    Reason for Admission:   menorrhage , anemia                   RRAT Score:       0              Plan for utilizing home health:      Not at this time                    Current Advanced Directive/Advance Care Plan:  Yes on chart - MPOA #1 Son [name] [name] [phone]. He will drive and participate in dc process.  MPOA #2 [name] [phone].    Likelihood of Readmission:  Low/green                         Transition of Care Plan:     Home, son [name] to drive.         Patient and/or next of kin has been given the ???Outpatient Observation Information and Notification??? letter and all questions answered.    Care Management Interventions  PCP Verified by CM: No(NP Owens Shark is leaving, she will see some one else at the office, office last seen in march)  Palliative Care Criteria Met (RRAT>21 & CHF Dx)?: No  Mode of Transport at Discharge: Other (see comment)(son [name])  Transition of Care Consult (CM Consult): Discharge Planning  MyChart Signup: No  Discharge Durable Medical Equipment: No  Physical Therapy Consult: No  Occupational Therapy Consult: No  Speech Therapy Consult: No  Current Support Network: Relative's Home(lives with mom)  Confirm Follow Up Transport: Family  Plan discussed with Pt/Family/Caregiver: Yes  Discharge Location  Discharge Placement: Home

## 2018-03-28 NOTE — Progress Notes (Signed)
Problem: Discharge Planning  Goal: *Discharge to safe environment  Outcome: Progressing Towards Goal

## 2018-03-28 NOTE — Progress Notes (Signed)
Formatting of this note is different from the original.  Images from the original note were not included.  a  GYN Progress note    Patient: Jillian Harvey MRN: [id number]  SSN: [id number]    Date of Birth: [birth year]  Age: 42 y.o.  Sex: female      POD: Day of Surgery      Admitted for Menorrhagia [N92.0] Procedures: Procedure(s):  davinci total laparoscopic hysterectomy, bilateral salpingectomy     Active Problems:    Menorrhagia (03/28/2018)        Patient Active Problem List    Diagnosis Date Noted   ? Menorrhagia 03/28/2018   .    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 Good pain control.  Ambulating, voiding and eating well, no nausea or vomiting.     Blood pressure 113/73, pulse 87, temperature 97.4 F (36.3 C), resp. rate 14, height 5\' 4"  (1.626 m), weight 112 kg (247 lb), last menstrual period 03/07/2018, SpO2 94 %.      Temp (24hrs), Avg:97.9 F (36.6 C), Min:97.4 F (36.3 C), Max:98.2 F (36.8 C)    WBC   Date/Time Value Ref Range Status   03/24/2018 02:47 PM 9.6 4.6 - 13.2 K/uL Final     HGB   Date/Time Value Ref Range Status   03/24/2018 02:47 PM 12.0 12.0 - 16.0 g/dL Final     PLATELET   Date/Time Value Ref Range Status   03/24/2018 02:47 PM 389 135 - 420 K/uL Final     Per my exam and/or the nursing assessment:  Chest is clear to auscultation.  Abdomen soft not tender.  Bowel sounds are normal  Incisions are clean  Legs are normal without tenderness, swelling, or redness    Impression,  Doing well    Plan: Probable DC in am, BS not so bad needs control until then    Osvaldo Human, MD  Electronically signed by Osvaldo Human, MD at 03/28/2018 12:52 PM EDT

## 2018-03-28 NOTE — Unmapped (Signed)
Formatting of this note is different from the original.  Images from the original note were not included.      Lynden Oxford, MD  [facility]  [address]  [place] [address]  [phone]    Operative Note for Robotic Assisted Hysterectomy    Name: Jillian Harvey   Medical Record Number: [id number]   Date of Birth: April 08, 1976  Today's Date: March 28, 2018    Preop Diagnosis: menorrhagie  n92.0, d64.9 anemia  Postop Diagnosis: menorrhagie  n92.0, d64.9 anemia    Procedure(s):  davinci total laparoscopic hysterectomy, bilateral salpingectomy    Surgeon: Osvaldo Human, MD  Surgeon(s):  Osvaldo Human, MD     Lennox Solders EVMS  Assistant: Circ-1: Lucretia Roers, RN  Scrub Tech-1: Judeen Hammans  Surg Asst-1: Letta Kocher   Anesthesia: General  EBL:50  Findings: 200  gm   size uterus  Specimens:   ID Type Source Tests Collected by Time Destination   1 : UTERUS, CERVIX, AND TUBES    Osvaldo Human, MD 03/28/2018 [phone] Pathology     Complications: none  Disposition: to the recovery room in stable condition  Implants: none   Procedure:     The patient was brought to the operating room where patient identification and surgery identification were completed with the patient and the OR team. The patient was transferred to the OR table and GETA was obtained without difficulty. Both arms were padded and tucked. SCD's were on and activated. The patient was placed in the lithotomy position in adjustable Allen stirrups.  The was carefully tested in trendelenburg before being prepped and draped in the normal sterile fashion. Patient identification and surgery identification were repeated with the surgeon and the OR team.        A weighted speculum was placed vaginally and the anterior lip of the cervix was grasped with a single toothed tenaculum.The uterus was sounded.  Stay sutures of 0-Vicryl were placed.  V care of large size was placed and normal saline was used to inflate the  intrauterine balloon .   All other instruments were removed. A foley catheter was placed with clear urine noted.         Attention was turned to the abdomen.  An 11 blade scaple was then used to make a 10 mm incision  above the umbilicus.  A Veress needle was introduced into the abdominal cavity with anterior tenting of the abdominal wall. Pneumoperitoneum was obtained using CO2 gas to a pressure of 15 mm of Hg. The Veress needle was removed and a 8 mm trocar port was placed without complication. The patient was placed in steep [place] and 8 mm incisions were made in the right and left lower quadrants, after .25% Marcaine with epinephrine was injected using an epidural  needle. Two 8 mm robotic trocar ports were placed under laparoscopic visualization without complication.      A separate assistant port was placed in the Right Lower Quadrant (RLQ) under direct vision.     A 8 mm non bladed trocar was then placed in the right  lower quadrant again after .25% Marcaine with epinephrine was injected using a spinal needle.  The Davinci robotic system was then brought up to the patient and docked without difficulty.  The surgeon then went to the surgeons console. The pelvis was examined with the above noted findings.        The left cornual region  of the uterus was grasped and the fallopian tubes were removed by using the bipolar sealing device. The utero-ovarian, round and broad ligaments were cauterized using the bipolar cautery and transected using the monopolar scissors. The bladder flap was created across the lower uterine segment. The uterine vessel bundle was skeletonized, cauterized with the bipolar cautery and cut with the monopolar scissors.  Attention was turned to the right cornual region where the same transections were performed. The V care cup was then elevated and the uterus retroverted by the operator's assistant.     .     A  total hysterectomy was accomplished by the following method:   An anterior  colpotomy was the preformed using the monopolar scissors and extended laterally.  The uterus was then anteverted and a posterior colpotomy preformed with the monopolar scissors and extended laterally and circumferential until the completion of the colpotomy was obtained. The uterus was then removed vaginally and the balloon bulb was inserted into the vagina  to maintain pneomoperitoneum.   The suture was introduced thorough one of the operative ports. The edges of the vaginal cuff were examined and vaginal cuff closure was then completed using V-loc 90 suture .      The pelvis was copiously irrigated with Gentamycin irrigant and all fluid was removed. Hemostasis was again noted. The ureters were identified bilaterally with normal peristalsis noted. The robot was the then undocked from the trocar and moved away from the patient.      The lower quadrant ports were removed and hemostasis was noted.The ports, as they were  8 mm and smaller were closed on the skin.    The incisions were closed with subcuticular 3-0 Monocryl suture.  The foley catheter was removed.at the close of the procedure. Sponge, lap, needle and instrument counts were correct x 2.  A post procedure pause was done to review the procedure, the findings, the specimen, blood loss, I+O as well as any concerns from the team.  The patient was transferred to the RR extubated, in stable condition.    Osvaldo Human, MD  March 28, 2018  Electronically signed by Osvaldo Human, MD at 03/28/2018 10:13 AM EDT

## 2018-03-28 NOTE — Progress Notes (Signed)
Formatting of this note might be different from the original.    Problem: Discharge Planning  Goal: *Discharge to safe environment  Outcome: Progressing Towards Goal    Electronically signed by [name], RN at 03/28/2018  2:33 PM EDT

## 2018-03-28 NOTE — Progress Notes (Signed)
Formatting of this note might be different from the original.  2000 Patient received in bed without any complaints voiced. Patient alert & oriented x4. Call light in reach & side rails up x3.    2345 Up & about to bathroom frequently to urinate. Tolerating it well. Encourage to use ICS.  Electronically signed by Real Cons, RN at 03/29/2018  2:43 AM EDT

## 2018-03-28 NOTE — Unmapped (Signed)
Formatting of this note is different from the original.  Recd care of pt from OR via stretcher.  Resp even and unlabored.  Attached to monitor.  VSS.  OR, MAR and anesthesia report acknowledged.  Will cont to monitor.    1157  TRANSFER - OUT REPORT:    Verbal report given to [place], Therapist, sports (name) on Jillian Harvey  being transferred to 2200 (unit) for routine post - op       Report consisted of patient?s Situation, Background, Assessment and   Recommendations(SBAR).     Information from the following report(s) SBAR, Kardex, OR Summary, MAR and Cardiac Rhythm sinus rhythm\ was reviewed with the receiving nurse.    Lines:   Peripheral IV 03/28/18 Left Hand (Active)   Site Assessment Clean, dry, & intact 03/28/2018 10:52 AM   Phlebitis Assessment 0 03/28/2018 10:52 AM   Infiltration Assessment 0 03/28/2018 10:52 AM   Dressing Status Clean, dry, & intact 03/28/2018 10:52 AM   Dressing Type Transparent;Tape 03/28/2018 10:52 AM   Hub Color/Line Status Pink;Infusing 03/28/2018 10:52 AM   Action Taken Open ports on tubing capped 03/28/2018 10:52 AM   Alcohol Cap Used Yes 03/28/2018 10:52 AM       Opportunity for questions and clarification was provided.      Patient transported with:   Tech      Electronically signed by Cydney Ok, RN at 03/28/2018 12:05 PM EDT

## 2018-03-28 NOTE — Progress Notes (Signed)
Formatting of this note is different from the original.  Chart reviewed and pt verified demographics. She lives with her mom.    Patient has designated _mom_______________________ to participate in his/her discharge plan and to receive any needed information.     Name: Jillian Harvey  Address:same as pt  Phone [phone]    Reason for Admission:   menorrhage , anemia    RRAT Score:       0       Plan for utilizing home health:      Not at this time    Current Advanced Directive/Advance Care Plan:  Yes on chart - MPOA #1 Son [name] [name] [phone]. He will drive and participate in dc process.  MPOA #2 [name] [id number].    Likelihood of Readmission:  Low/green    Transition of Care Plan:     Home, son [name] to drive.         Patient and/or next of kin has been given the ?Outpatient Observation Information and Notification? letter and all questions answered.    Care Management Interventions  PCP Verified by CM: No(NP Owens Shark is leaving, she will see some one else at the office, office last seen in march)  Palliative Care Criteria Met (RRAT>21 & CHF Dx)?: No  Mode of Transport at Discharge: Other (see comment)(son [name])  Transition of Care Consult (CM Consult): Discharge Planning  MyChart Signup: No  Discharge Durable Medical Equipment: No  Physical Therapy Consult: No  Occupational Therapy Consult: No  Speech Therapy Consult: No  Current Support Network: Relative's Home(lives with mom)  Confirm Follow Up Transport: Family  Plan discussed with Pt/Family/Caregiver: Yes  Discharge Location  Discharge Placement: Home  Electronically signed by [name], RN at 03/28/2018  2:40 PM EDT

## 2018-03-28 NOTE — Unmapped (Signed)
Formatting of this note might be different from the original.  Bedside and Verbal shift change report given to [employer] (Soil scientist) by Garnette Scheuermann (offgoing nurse). Report included the following information SBAR, Kardex, Intake/Output and MAR.   Electronically signed by Earnestine Mealing, RN at 03/28/2018  7:26 PM EDT

## 2018-03-28 NOTE — Progress Notes (Signed)
Formatting of this note is different from the original.  Chaplain conducted an initial consultation and Spiritual Assessment for Jillian Harvey, who is a 42 y.o.,female. Patient?s Primary Language is: [place].   According to the patient?s EMR Religious Affiliation is: No preference.     The reason the Patient came to the hospital is:   Patient Active Problem List    Diagnosis Date Noted   ? Menorrhagia 03/28/2018       The Chaplain provided the following Interventions:  Initiated a relationship of care and support.   Provided information about Spiritual Care Services.  Offered prayer and assurance of continued prayers on patient's behalf.     The following outcomes were achieved:  Patient expressed gratitude for chaplain's visit.    Assessment:  There are no further spiritual or religious issues which require intervention at this time.     Plan:  Chaplains will continue to follow and will provide pastoral care on an as needed/requested basis.    Kennith Gain, M.Div.  Chaplain, [place]  [facility]: [id number]: [phone]  Electronically signed by Kennith Gain at 03/28/2018  4:27 PM EDT

## 2018-03-28 NOTE — Op Note (Signed)
Op Notes by Osvaldo Human, MD at 03/28/18 1010                Author: Osvaldo Human, MD  Service: Obstetrics & Gynecology  Author Type: Physician       Filed: 03/28/18 1013  Date of Service: 03/28/18 1010  Status: Signed          Editor: Osvaldo Human, MD (Physician)                       Lynden Oxford, MD   [facility]   [address] [place] [place] [address]   [phone]      Operative Note for Robotic Assisted Hysterectomy      Name: Jillian Harvey     Medical Record Number: [id number]     Date of Birth: 1976/04/14   Today's Date: March 28, 2018      Preop Diagnosis: menorrhagie  n92.0, d64.9 anemia   Postop Diagnosis: menorrhagie  n92.0, d64.9 anemia      Procedure(s):   davinci total laparoscopic hysterectomy, bilateral salpingectomy      Surgeon: Osvaldo Human, MD   Surgeon(s):   Osvaldo Human, MD       Lennox Solders EVMS   Assistant: Circ-1: Lucretia Roers, RN   Scrub Tech-1: Judeen Hammans   Surg Asst-1: Letta Kocher    Anesthesia: General   EBL:50   Findings: 200  gm   size uterus   Specimens:            ID  Type  Source  Tests  Collected by  Time  Destination             1 : UTERUS, CERVIX, AND TUBES        Osvaldo Human, MD  03/28/2018 [phone]  Pathology         Complications: none   Disposition: to the recovery room in stable condition   Implants:  none    Procedure:      The patient was brought to the operating room where patient identification and surgery identification were completed with the patient and the OR team. The patient was transferred to the OR table and GETA was obtained without difficulty. Both arms were  padded and tucked. SCD's were on and activated. The patient was placed in the lithotomy position in adjustable Allen stirrups.  The was carefully tested in trendelenburg before being prepped and draped in the normal sterile fashion. Patient identification  and surgery identification were repeated with the surgeon and the OR team.           A  weighted speculum was placed vaginally and the anterior lip of the cervix was grasped with a single toothed tenaculum.The uterus was sounded.  Stay sutures of 0-Vicryl were placed.  V care of large size was placed and normal saline was used to inflate  the intrauterine balloon .   All other instruments were removed. A foley catheter was placed with clear urine noted.           Attention was turned to the abdomen.  An 11 blade scaple was then used to make a 10 mm incision  above the umbilicus.  A Veress needle was introduced into the abdominal cavity with anterior tenting of the abdominal wall. Pneumoperitoneum was obtained  using CO2 gas to a pressure of 15 mm of Hg. The Veress  needle was removed and a 8 mm trocar port was placed without complication. The patient was placed in steep [place] and 8 mm incisions were made in the right and left lower quadrants, after .25%  Marcaine with epinephrine was injected using an epidural  needle. Two 8 mm robotic trocar ports were placed under laparoscopic visualization without complication.          A separate assistant port was placed in the Right Lower Quadrant (RLQ) under direct vision.           A 8 mm non bladed trocar was then placed in the right  lower quadrant again after .25% Marcaine with epinephrine was injected using a spinal needle.  The Davinci robotic system was then brought up to the patient and docked without difficulty.  The surgeon  then went to the surgeons console. The pelvis was examined with the above noted findings.          The left cornual region of the uterus was grasped and the fallopian tubes were removed by using the bipolar sealing device. The utero-ovarian, round and broad ligaments were cauterized using the bipolar cautery and transected using the monopolar scissors.  The bladder flap was created across the lower uterine segment. The uterine vessel bundle was skeletonized, cauterized with the bipolar cautery and cut with the monopolar  scissors.  Attention was turned to the right cornual region where the same transections  were performed. The V care cup was then elevated and the uterus retroverted by the operator's assistant.       .     A  total hysterectomy was accomplished by the following method:   An anterior colpotomy was the preformed using the monopolar scissors  and extended laterally.  The uterus was then anteverted and a posterior colpotomy preformed with the monopolar scissors and extended laterally and circumferential until the completion of the colpotomy was obtained. The uterus was then removed vaginally  and the balloon bulb was inserted into the vagina  to maintain pneomoperitoneum.   The suture was introduced thorough one of the operative ports. The edges of the vaginal cuff were examined and vaginal cuff closure was then completed using V-loc 90 suture  .        The pelvis was copiously irrigated with Gentamycin irrigant and all fluid was removed. Hemostasis was again noted. The ureters were identified bilaterally with normal peristalsis noted. The robot was the then undocked from the trocar and moved away  from the patient.        The lower quadrant ports were removed and hemostasis was noted.The ports, as they were  8 mm and smaller were closed on the skin.            The incisions were closed with subcuticular 3-0 Monocryl suture.  The foley catheter was removed.at the close of the procedure. Sponge, lap, needle and instrument counts were correct x 2.  A post procedure pause was done to review the procedure, the findings,  the specimen, blood loss, I+O as well as any concerns from the team.  The patient was transferred to the RR extubated, in stable condition.      Osvaldo Human, MD   March 28, 2018

## 2018-03-28 NOTE — Interval H&P Note (Signed)
Formatting of this note is different from the original.  H&P Update:  Jillian Harvey was seen and examined.  History and physical has been reviewed. The patient has been examined. There have been no significant clinical changes since the completion of the originally dated History and Physical.    Signed By: Samuella Cota     March 28, 2018 7:08 AM      Electronically signed by Osvaldo Human, MD at 03/28/2018  7:22 AM EDT    Source Note - Osvaldo Human, MD - 03/24/2018  3:27 PM EDT

## 2018-03-28 NOTE — Unmapped (Signed)
Formatting of this note might be different from the original.  Spoke to son [name] to update family regarding patient's surgical status.  Electronically signed by Lucretia Roers, RN at 03/28/2018 10:10 AM EDT

## 2018-03-28 NOTE — Interval H&P Note (Signed)
Formatting of this note is different from the original.  H&P Update:  Jillian Harvey was seen and examined.  History and physical has been reviewed. The patient has been examined. There have been no significant clinical changes since the completion of the originally dated History and Physical.    Signed By: Osvaldo Human, MD     March 28, 2018 7:23 AM      Electronically signed by Osvaldo Human, MD at 03/28/2018  7:23 AM EDT    Source Note - Osvaldo Human, MD - 03/24/2018  3:27 PM EDT

## 2018-03-28 NOTE — Unmapped (Signed)
Formatting of this note is different from the original.  Pre-Op Summary    Pt arrived via car with family/friend and is oriented to time, place, person and situation. Patient with steady gait with none assistive devices.     Visit Vitals  BP 113/67 (BP 1 Location: Left arm, BP Patient Position: At rest)   Pulse 89   Temp 97.9 F (36.6 C)   Resp 16   Ht 5\' 4"  (1.626 m)   Wt 112 kg (247 lb)   LMP 03/07/2018   SpO2 98%   BMI 42.40 kg/m     Peripheral IV located on Left hand .    Patients belongings are located with son.    Patient's point of contact is son- daniel and their contact number is: [phone]. They will be in the waiting room. They are able to receive medication information. They will be their ride home.    Electronically signed by Abutin-Sims, Mariecarr O at 03/28/2018  6:42 AM EDT

## 2018-03-28 NOTE — Anesthesia Pre-Procedure Evaluation (Signed)
Relevant Problems   No relevant active problems       Anesthetic History   No history of anesthetic complications            Review of Systems / Medical History  Patient summary reviewed and pertinent labs reviewed    Pulmonary            Asthma        Neuro/Psych         Psychiatric history     Cardiovascular  Within defined limits                Exercise tolerance: >4 METS     GI/Hepatic/Renal     GERD           Endo/Other        Obesity     Other Findings              Physical Exam    Airway  Mallampati: II  TM Distance: 4 - 6 cm  Neck ROM: normal range of motion   Mouth opening: Normal     Cardiovascular  Regular rate and rhythm,  S1 and S2 normal,  no murmur, click, rub, or gallop  Rhythm: regular  Rate: normal         Dental    Dentition: Lower dentition intact and Upper dentition intact     Pulmonary  Breath sounds clear to auscultation               Abdominal  GI exam deferred       Other Findings            Anesthetic Plan    ASA: 3  Anesthesia type: general          Induction: Intravenous  Anesthetic plan and risks discussed with: Patient

## 2018-03-28 NOTE — Other (Signed)
Bedside and Verbal shift change report given to Beth (oncoming nurse) by Andria (offgoing nurse). Report included the following information SBAR, Kardex, Intake/Output and MAR.

## 2018-03-29 LAB — CBC W/O DIFF
HCT: 32.2 % — ABNORMAL LOW (ref 35.0–45.0)
HGB: 10.2 g/dL — ABNORMAL LOW (ref 12.0–16.0)
MCH: 27.2 PG (ref 24.0–34.0)
MCHC: 31.7 g/dL (ref 31.0–37.0)
MCV: 85.9 FL (ref 74.0–97.0)
MPV: 8.4 FL — ABNORMAL LOW (ref 9.2–11.8)
PLATELET: 315 10*3/uL (ref 135–420)
RBC: 3.75 M/uL — ABNORMAL LOW (ref 4.20–5.30)
RDW: 22.7 % — ABNORMAL HIGH (ref 11.6–14.5)
WBC: 14.9 10*3/uL — ABNORMAL HIGH (ref 4.6–13.2)

## 2018-03-29 MED FILL — HYDROCODONE-ACETAMINOPHEN 5 MG-325 MG TAB: 5-325 mg | ORAL | Qty: 1

## 2018-03-29 MED FILL — MORPHINE 2 MG/ML INJECTION: 2 mg/mL | INTRAMUSCULAR | Qty: 1

## 2018-03-29 MED FILL — ACETAMINOPHEN 325 MG TABLET: 325 mg | ORAL | Qty: 2

## 2018-03-29 NOTE — Other (Signed)
I have reviewed discharge instructions with the patient.  The patient verbalized understanding. States her son will be here around 0930 for pick up.   States she was given a Norco prescription on last visit with Dr. Ysidro Evert for use post op pain.

## 2018-03-29 NOTE — Discharge Summary (Signed)
Gynecology Discharge Note          Name: Jillian Harvey   Medical Record Number: [id number]   Date of Birth: 1976/12/21  Today's Date: 03/29/2018  .  Admit date: 03/28/2018    Discharge Date: 03/29/2018    Attending Physician: Osvaldo Human, MD    Admission Diagnoses: Menorrhagia [N92.0]    Post op Diagnosis: menorrhagie  n92.0, d64.9 anemia    Historical Additional  Problem List.:   Problem List as of 03/29/2018 Date Reviewed: 11/14/2014          Codes Class Noted - Resolved    Menorrhagia ICD-10-CM: N92.0  ICD-9-CM: 626.2  03/28/2018 - Present        * (Principal) RESOLVED: Fibroids, submucosal ICD-10-CM: D25.0  ICD-9-CM: 218.0  03/28/2018 - 03/28/2018                 Procedures: Procedure(s):  davinci total laparoscopic hysterectomy, bilateral salpingectomy  Patient doing well post-op     POD: 1 Day Post-Op  from Procedure(s):  davinci total laparoscopic hysterectomy, bilateral salpingectomy without significant complaints.  Pain controlled on current medication.  Voiding without difficulty. Patient bowel function is returning.     Vitals:    Patient Vitals for the past 8 hrs:   BP Temp Pulse Resp SpO2   03/29/18 0354 95/57 98.5 ??F (36.9 ??C) (!) 55 17 98 %     Temp (24hrs), Avg:98.1 ??F (36.7 ??C), Min:97.4 ??F (36.3 ??C), Max:98.5 ??F (36.9 ??C)    Exam:   Per myself or nursing assessment:               Patient without distress.               Lungs clear               Abdomen soft,  nontender.                 Incision dry and clean without erythema.               Lower extremities are negative for swelling, cords, or tenderness.    Lab/Data Review:      WBC   Date/Time Value Ref Range Status   03/29/2018 05:50 AM 14.9 (H) 4.6 - 13.2 K/uL Final   03/24/2018 02:47 PM 9.6 4.6 - 13.2 K/uL Final     HGB   Date/Time Value Ref Range Status   03/29/2018 05:50 AM 10.2 (L) 12.0 - 16.0 [name]/dL Final   03/24/2018 02:47 PM 12.0 12.0 - 16.0 [name]/dL Final     PLATELET   Date/Time Value Ref Range Status    03/29/2018 05:50 AM 315 135 - 420 K/uL Final               Assessment and Plan:  Patient appears to be having uncomplicated post Procedure(s):  davinci total laparoscopic hysterectomy, bilateral salpingectomy          Medications:  The patient has been given prescriptions at her preoperative visit for pain management  as directed by my office. You can take the more powerful narcotics as needed, but to decrease the use and switch to tylenol or motrin and may take as much as 800 mg of motrin three times a day if not combining with another NSAID.     Course:  Jillian Harvey did well in her hospital course and seems to understand her instructions well and will follow up as we discussed.       Condition  on discharge:  Good and stable.      Patient Instructions:Continue routine post-op care. With discharge home.     Signed By:  Osvaldo Human, MD     March 29, 2018

## 2018-03-29 NOTE — Unmapped (Signed)
Formatting of this note might be different from the original.  Bedside and Verbal shift change report given to [employer] (oncoming nurse) by Real Cons, RN   (offgoing nurse). Report included the following information SBAR, Kardex and MAR.     Electronically signed by Real Cons, RN at 03/29/2018  8:21 AM EDT

## 2018-03-29 NOTE — Unmapped (Signed)
Formatting of this note might be different from the original.  I have reviewed discharge instructions with the patient.  The patient verbalized understanding. States her son will be here around 0930 for pick up.   States she was given a Norco prescription on last visit with Dr. Ysidro Evert for use post op pain.    Electronically signed by Harvie Heck, RN at 03/29/2018  9:18 AM EDT

## 2018-03-29 NOTE — Progress Notes (Signed)
Formatting of this note might be different from the original.  Care Management Interventions  PCP Verified by CM: No(NP Owens Shark is leaving, she will see some one else at the office, office last seen in march)  Palliative Care Criteria Met (RRAT>21 & CHF Dx)?: No  Mode of Transport at Discharge: Other (see comment)(son [name])  Transition of Care Consult (CM Consult): Other(home)  MyChart Signup: No  Discharge Durable Medical Equipment: No  Physical Therapy Consult: No  Occupational Therapy Consult: No  Speech Therapy Consult: No  Current Support Network: Relative's Home(lives with mom)  Confirm Follow Up Transport: Family  Plan discussed with Pt/Family/Caregiver: Yes  Discharge Location  Discharge Placement: Home  Electronically signed by Gean Birchwood, RN at 03/29/2018  9:14 AM EDT

## 2018-03-29 NOTE — Discharge Summary (Signed)
Discharge Summary by Osvaldo Human, MD at 03/29/18 [phone]                Author: Osvaldo Human, MD  Service: Obstetrics & Gynecology  Author Type: Physician       Filed: 03/29/18 0743  Date of Service: 03/29/18 0742  Status: Signed          Editor: Osvaldo Human, MD (Physician)               Gynecology Discharge Note                 Name: Jillian Harvey     Medical Record Number: [id number]     Date of Birth: 1976-07-16   Today's Date: 03/29/2018   .   Admit date: 03/28/2018      Discharge Date: 03/29/2018      Attending Physician: Osvaldo Human, MD      Admission Diagnoses: Menorrhagia [N92.0]      Post op Diagnosis: menorrhagie  n92.0, d64.9 anemia      Historical Additional  Problem List.:       Problem List as of 03/29/2018  Date Reviewed:  11-Nov-2014                        Codes  Class  Noted - Resolved             Menorrhagia  ICD-10-CM: N92.0   ICD-9-CM: 626.2    03/28/2018 - Present                       * (Principal) RESOLVED: Fibroids, submucosal  ICD-10-CM: D25.0   ICD-9-CM: 218.0    03/28/2018 - 03/28/2018                              Procedures: Procedure(s):   davinci total laparoscopic hysterectomy, bilateral salpingectomy   Patient doing well post-op       POD: 1 Day Post-Op  from Procedure(s):   davinci total laparoscopic hysterectomy, bilateral salpingectomy without significant complaints.  Pain controlled on current medication.  Voiding without difficulty. Patient bowel function is returning.       Vitals:     Patient Vitals for the past 8 hrs:            BP  Temp  Pulse  Resp  SpO2            03/29/18 0354  95/57  98.5 ??F (36.9 ??C)  (!) 55  17  98 %        Temp (24hrs), Avg:98.1 ??F (36.7 ??C), Min:97.4 ??F (36.3 ??C), Max:98.5 ??F (36.9 ??C)      Exam:   Per myself or nursing assessment:                Patient without distress.                Lungs clear                Abdomen soft,  nontender.                  Incision dry and clean without erythema.                Lower extremities are negative for  swelling, cords, or tenderness.      Lab/Data Review:  WBC          Date/Time  Value  Ref Range  Status          03/29/2018 05:50 AM  14.9 (H)  4.6 - 13.2 K/uL  Final          03/24/2018 02:47 PM  9.6  4.6 - 13.2 K/uL  Final          HGB          Date/Time  Value  Ref Range  Status          03/29/2018 05:50 AM  10.2 (L)  12.0 - 16.0 Jyden Kromer/dL  Final          03/24/2018 02:47 PM  12.0  12.0 - 16.0 Kable Haywood/dL  Final          PLATELET          Date/Time  Value  Ref Range  Status          03/29/2018 05:50 AM  315  135 - 420 K/uL  Final                       Assessment and Plan:  Patient appears to be having uncomplicated post  Procedure(s):   davinci total laparoscopic hysterectomy, bilateral salpingectomy              Medications:  The patient has been given prescriptions at her preoperative visit for pain management  as directed by my office. You can take the more powerful narcotics as needed, but to decrease the  use and switch to tylenol or motrin and may take as much as 800 mg of motrin three times a day if not combining with another NSAID.       Course:  [name] did well in  her hospital course and seems to understand her instructions well and will follow up as we discussed.         Condition on discharge:  Good and stable.        Patient Instructions:Continue routine post-op care. With discharge home.          Signed By:   Osvaldo Human, MD        March 29, 2018

## 2018-03-29 NOTE — Discharge Summary (Signed)
Formatting of this note is different from the original.  Images from the original note were not included.  Gynecology Discharge Note    Name: Jillian Harvey   Medical Record Number: [id number]   Date of Birth: 11-04-1976  Today's Date: 03/29/2018  .  Admit date: 03/28/2018    Discharge Date: 03/29/2018    Attending Physician: Osvaldo Human, MD    Admission Diagnoses: Menorrhagia [N92.0]    Post op Diagnosis: menorrhagie  n92.0, d64.9 anemia    Historical Additional  Problem List.:   Problem List as of 03/29/2018 Date Reviewed: 2014-10-30          Codes Class Noted - Resolved    Menorrhagia ICD-10-CM: N92.0  ICD-9-CM: 626.2  03/28/2018 - Present      * (Principal) RESOLVED: Fibroids, submucosal ICD-10-CM: D25.0  ICD-9-CM: 218.0  03/28/2018 - 03/28/2018           Procedures: Procedure(s):  davinci total laparoscopic hysterectomy, bilateral salpingectomy  Patient doing well post-op     POD: 1 Day Post-Op  from Procedure(s):  davinci total laparoscopic hysterectomy, bilateral salpingectomy without significant complaints.  Pain controlled on current medication.  Voiding without difficulty. Patient bowel function is returning.     Vitals:    Patient Vitals for the past 8 hrs:   BP Temp Pulse Resp SpO2   03/29/18 0354 95/57 98.5 F (36.9 C) (!) 55 17 98 %     Temp (24hrs), Avg:98.1 F (36.7 C), Min:97.4 F (36.3 C), Max:98.5 F (36.9 C)    Exam:   Per myself or nursing assessment:               Patient without distress.               Lungs clear               Abdomen soft,  nontender.                 Incision dry and clean without erythema.               Lower extremities are negative for swelling, cords, or tenderness.    Lab/Data Review:      WBC   Date/Time Value Ref Range Status   03/29/2018 05:50 AM 14.9 (H) 4.6 - 13.2 K/uL Final   03/24/2018 02:47 PM 9.6 4.6 - 13.2 K/uL Final     HGB   Date/Time Value Ref Range Status   03/29/2018 05:50 AM 10.2 (L) 12.0 - 16.0 g/dL Final   03/24/2018 02:47 PM 12.0 12.0 - 16.0 g/dL Final      PLATELET   Date/Time Value Ref Range Status   03/29/2018 05:50 AM 315 135 - 420 K/uL Final     Assessment and Plan:  Patient appears to be having uncomplicated post Procedure(s):  davinci total laparoscopic hysterectomy, bilateral salpingectomy      Medications:  The patient has been given prescriptions at her preoperative visit for pain management  as directed by my office. You can take the more powerful narcotics as needed, but to decrease the use and switch to tylenol or motrin and may take as much as 800 mg of motrin three times a day if not combining with another NSAID.     Course:  Besse did well in her hospital course and seems to understand her instructions well and will follow up as we discussed.       Condition on discharge:  Good and stable.  Patient Instructions:Continue routine post-op care. With discharge home.     Signed By:  Osvaldo Human, MD     March 29, 2018        Electronically signed by Osvaldo Human, MD at 03/29/2018  7:43 AM EDT

## 2018-03-29 NOTE — Progress Notes (Signed)
Formatting of this note might be different from the original.  0750..Bedside and Verbal shift change report given to this nurse Cathey Endow (oncoming nurse) by Lenora Boys (offgoing nurse). Report included the following information SBAR, Kardex and MAR. Patient in bed with HOB elevated, bed locked at lowest position call bell in reach.  Patient is A&OX4 and has purposeful movement in all extremities.  4 lap sites to abdomen are c/d/i and well approximated. Patient shows no signs of distress at this time. Will continue to monitor.    Electronically signed by Lyn Hollingshead, RN at 03/29/2018  9:36 AM EDT

## 2018-03-29 NOTE — Progress Notes (Addendum)
62.Marland KitchenBedside and Verbal shift change report given to this nurse Cathey Endow (oncoming nurse) by Lenora Boys (offgoing nurse). Report included the following information SBAR, Kardex and MAR. Patient in bed with HOB elevated, bed locked at lowest position call bell in reach.  Patient is A&OX4 and has purposeful movement in all extremities.  4 lap sites to abdomen are c/d/i and well approximated. Patient shows no signs of distress at this time. Will continue to monitor.

## 2018-03-29 NOTE — Other (Signed)
Bedside and Verbal shift change report given to [place] (oncoming nurse) by Real Cons, RN   (offgoing nurse). Report included the following information SBAR, Kardex and MAR.

## 2018-03-29 NOTE — Progress Notes (Signed)
Care Management Interventions  PCP Verified by CM: No(NP Owens Shark is leaving, she will see some one else at the office, office last seen in march)  Palliative Care Criteria Met (RRAT>21 & CHF Dx)?: No  Mode of Transport at Discharge: Other (see comment)(son [name])  Transition of Care Consult (CM Consult): Other(home)  MyChart Signup: No  Discharge Durable Medical Equipment: No  Physical Therapy Consult: No  Occupational Therapy Consult: No  Speech Therapy Consult: No  Current Support Network: Relative's Home(lives with mom)  Confirm Follow Up Transport: Family  Plan discussed with Pt/Family/Caregiver: Yes  Discharge Location  Discharge Placement: Home

## 2018-03-31 MED FILL — GLYCOPYRROLATE 0.2 MG/ML IJ SOLN: 0.2 mg/mL | INTRAMUSCULAR | Qty: 2

## 2018-03-31 MED FILL — LACTATED RINGERS IV: INTRAVENOUS | Qty: 1050

## 2018-03-31 MED FILL — DEXAMETHASONE SODIUM PHOSPHATE 4 MG/ML IJ SOLN: 4 mg/mL | INTRAMUSCULAR | Qty: 1

## 2018-03-31 MED FILL — NEOSTIGMINE METHYLSULFATE 3 MG/3 ML (1 MG/ML) IV SYRINGE: 3 mg/ mL (1 mg/mL) | INTRAVENOUS | Qty: 3

## 2018-03-31 MED FILL — SUCCINYLCHOLINE CHLORIDE 100 MG/5 ML (20 MG/ML) IV SYRINGE: 100 mg/5 mL (20 mg/mL) | INTRAVENOUS | Qty: 5

## 2018-03-31 MED FILL — CLEOCIN 900 MG/50 ML IN 5 % DEXTROSE INTRAVENOUS PIGGYBACK: 900 mg/50 mL | INTRAVENOUS | Qty: 50

## 2018-03-31 MED FILL — PROPOFOL 10 MG/ML IV EMUL: 10 mg/mL | INTRAVENOUS | Qty: 200

## 2018-03-31 MED FILL — XYLOCAINE-MPF 20 MG/ML (2 %) INJECTION SOLUTION: 20 mg/mL (2 %) | INTRAMUSCULAR | Qty: 100

## 2018-03-31 MED FILL — VECURONIUM BROMIDE 10 MG IV SOLR: 10 mg | INTRAVENOUS | Qty: 7

## 2018-03-31 MED FILL — ONDANSETRON (PF) 4 MG/2 ML INJECTION: 4 mg/2 mL | INTRAMUSCULAR | Qty: 2

## 2021-03-12 ENCOUNTER — Ambulatory Visit (INDEPENDENT_AMBULATORY_CARE_PROVIDER_SITE_OTHER): Payer: BC Managed Care – PPO | Admitting: Physician Assistant

## 2021-03-12 ENCOUNTER — Encounter: Payer: Self-pay | Admitting: Physician Assistant

## 2021-03-12 ENCOUNTER — Other Ambulatory Visit (INDEPENDENT_AMBULATORY_CARE_PROVIDER_SITE_OTHER): Payer: BC Managed Care – PPO

## 2021-03-12 VITALS — BP 100/70 | HR 105 | Ht 64.0 in | Wt 202.0 lb

## 2021-03-12 DIAGNOSIS — E669 Obesity, unspecified: Secondary | ICD-10-CM | POA: Insufficient documentation

## 2021-03-12 DIAGNOSIS — Z9884 Bariatric surgery status: Secondary | ICD-10-CM

## 2021-03-12 DIAGNOSIS — Z6841 Body Mass Index (BMI) 40.0 and over, adult: Secondary | ICD-10-CM

## 2021-03-12 DIAGNOSIS — R109 Unspecified abdominal pain: Secondary | ICD-10-CM

## 2021-03-12 DIAGNOSIS — M549 Dorsalgia, unspecified: Secondary | ICD-10-CM | POA: Diagnosis not present

## 2021-03-12 DIAGNOSIS — G8929 Other chronic pain: Secondary | ICD-10-CM | POA: Insufficient documentation

## 2021-03-12 LAB — CBC WITH DIFFERENTIAL/PLATELET
Basophils Absolute: 0.1 10*3/uL (ref 0.0–0.1)
Basophils Relative: 0.9 % (ref 0.0–3.0)
Eosinophils Absolute: 0.3 10*3/uL (ref 0.0–0.7)
Eosinophils Relative: 2.6 % (ref 0.0–5.0)
HCT: 38.2 % (ref 36.0–46.0)
Hemoglobin: 13.1 g/dL (ref 12.0–15.0)
Lymphocytes Relative: 36.8 % (ref 12.0–46.0)
Lymphs Abs: 3.8 10*3/uL (ref 0.7–4.0)
MCHC: 34.2 g/dL (ref 30.0–36.0)
MCV: 89.1 fl (ref 78.0–100.0)
Monocytes Absolute: 0.6 10*3/uL (ref 0.1–1.0)
Monocytes Relative: 5.7 % (ref 3.0–12.0)
Neutro Abs: 5.6 10*3/uL (ref 1.4–7.7)
Neutrophils Relative %: 54 % (ref 43.0–77.0)
Platelets: 436 10*3/uL — ABNORMAL HIGH (ref 150.0–400.0)
RBC: 4.29 Mil/uL (ref 3.87–5.11)
RDW: 13.9 % (ref 11.5–15.5)
WBC: 10.3 10*3/uL (ref 4.0–10.5)

## 2021-03-12 LAB — COMPREHENSIVE METABOLIC PANEL
ALT: 22 U/L (ref 0–35)
AST: 17 U/L (ref 0–37)
Albumin: 4.5 g/dL (ref 3.5–5.2)
Alkaline Phosphatase: 86 U/L (ref 39–117)
BUN: 7 mg/dL (ref 6–23)
CO2: 26 mEq/L (ref 19–32)
Calcium: 9.4 mg/dL (ref 8.4–10.5)
Chloride: 105 mEq/L (ref 96–112)
Creatinine, Ser: 0.73 mg/dL (ref 0.40–1.20)
GFR: 99.04 mL/min (ref >60.00)
Glucose, Bld: 103 mg/dL — ABNORMAL HIGH (ref 70–99)
Potassium: 4.1 mEq/L (ref 3.5–5.1)
Sodium: 139 mEq/L (ref 135–145)
Total Bilirubin: 0.4 mg/dL (ref 0.2–1.2)
Total Protein: 7.7 g/dL (ref 6.0–8.3)

## 2021-03-12 LAB — LIPASE: Lipase: 21 U/L (ref 11.0–59.0)

## 2021-03-12 MED ORDER — OMEPRAZOLE 40 MG PO CPDR: DELAYED_RELEASE_CAPSULE | ORAL | 6 refills | Status: DC

## 2021-03-12 NOTE — Progress Notes (Signed)
Subjective:    Patient ID: Rhonda Guerrero, female    DOB: 1976/12/08, 45 y.o.   MRN: [id number]  HPI Rhonda Guerrero is a pleasant 45 year old African-American female, new to GI today referred by Stana Bunting, PA-C/[facility] for evaluation of episodic severe abdominal pain. Patient just relocated to [facility] in January and had previously been living in [place]. She underwent a duodenal switch procedure/bariatric surgery on 11/29/2020 done in [place].  She says she has successfully lost 57 pounds since that time.  She was not having any abdominal pain prior to surgery and says she started having pain in early January.  She has had 4 discrete episodes of this pain which she says is quite severe constant hard pain that last for about 2 hours.  This pain doubles her over in a fetal position, is not colicky, no radiation to her back, not associated with any nausea vomiting or diarrhea.  No associated fever.  She says it feels as if her whole abdomen hurts with these episodes and she is unable to localize. Her last episode was about a week ago.  In between these episodes she has been feeling okay without any significant abdominal pain.  She has been following prescribed diet.  She is not on any aspirin or NSAIDs.  She was given a prescription for Protonix postoperatively to use as needed for reflux but says she has not been having a lot of problems with acid reflux. Patient does have history of prior C-section, hysterectomy and bilateral tubal ligation.  She is also had a prior cervical fusion and lumbar fusion and suffers from chronic back pain which is what she says motivated her to have the bariatric surgery hoping this would improve her back pain.  She uses tramadol as needed for back pain and is also on gabapentin. Patient mentions that she was told that her liver enzymes were normal in May 2021 she had a set of liver tests in December with one of her transaminases mildly elevated in the 53 range.   She believes that LFTs in January were normal.  Review of Systems Pertinent positive and negative review of systems were noted in the above HPI section.  All other review of systems was otherwise negative.  Outpatient Encounter Medications as of 03/12/2021  Medication Sig  . albuterol (VENTOLIN HFA) 108 (90 Base) MCG/ACT inhaler Inhale 2 puffs into the lungs 4 (four) times daily as needed.  Marland Kitchen buPROPion (WELLBUTRIN XL) 150 MG 24 hr tablet Take 1 tablet by mouth daily.  . cyclobenzaprine (FLEXERIL) 10 MG tablet Take 1 tablet by mouth at bedtime.  Marland Kitchen escitalopram (LEXAPRO) 20 MG tablet Take 1 tablet by mouth daily.  Marland Kitchen gabapentin (NEURONTIN) 300 MG capsule Take 1 capsule by mouth in the morning, at noon, and at bedtime.  Marland Kitchen omeprazole (PRILOSEC) 40 MG capsule Open 1 capsule and sprinkle into applesauce every morning.  . [DISCONTINUED] pantoprazole (PROTONIX) 40 MG tablet Take 1 tablet by mouth daily.  . [DISCONTINUED] cetirizine (ZYRTEC) 10 MG tablet Take 1 tablet by mouth daily.  . [DISCONTINUED] Lemborexant (DAYVIGO) 10 MG TABS   . [DISCONTINUED] ondansetron (ZOFRAN-ODT) 8 MG disintegrating tablet Take 1 tablet by mouth in the morning and at bedtime.   No facility-administered encounter medications on file as of 03/12/2021.   Allergies  Allergen Reactions  . Latex Itching, Rash, Shortness Of Breath and Swelling    PATIENT STATES "ONLY ALLERGIC TO THE POWDER INSIDE LATEX GLOVES" PATIENT STATES "ONLY ALLERGIC TO THE POWDER INSIDE  LATEX GLOVES" PATIENT STATES "ONLY ALLERGIC TO THE POWDER INSIDE LATEX GLOVES" PATIENT STATES "ONLY ALLERGIC TO THE POWDER INSIDE LATEX GLOVES" PATIENT STATES "ONLY ALLERGIC TO THE POWDER INSIDE LATEX GLOVES" PATIENT STATES "ONLY ALLERGIC TO THE POWDER INSIDE LATEX GLOVES" PATIENT STATES "ONLY ALLERGIC TO THE POWDER INSIDE LATEX GLOVES" PATIENT STATES "ONLY ALLERGIC TO THE POWDER INSIDE LATEX GLOVES" PATIENT STATES "ONLY ALLERGIC TO THE POWDER INSIDE LATEX  GLOVES" PATIENT STATES "ONLY ALLERGIC TO THE POWDER INSIDE LATEX GLOVES" PATIENT STATES "ONLY ALLERGIC TO THE POWDER INSIDE LATEX GLOVES" PATIENT STATES "ONLY ALLERGIC TO THE POWDER INSIDE LATEX GLOVES" PATIENT STATES "ONLY ALLERGIC TO THE POWDER INSIDE LATEX GLOVES" PATIENT STATES "ONLY ALLERGIC TO THE POWDER INSIDE LATEX GLOVES" PATIENT STATES "ONLY ALLERGIC TO THE POWDER INSIDE LATEX GLOVES" PATIENT STATES "ONLY ALLERGIC TO THE POWDER INSIDE LATEX GLOVES" PATIENT STATES "ONLY ALLERGIC TO THE POWDER INSIDE LATEX GLOVES" PATIENT STATES "ONLY ALLERGIC TO THE POWDER INSIDE LATEX GLOVES" PATIENT STATES "ONLY ALLERGIC TO THE POWDER INSIDE LATEX GLOVES" PATIENT STATES "ONLY ALLERGIC TO THE POWDER INSIDE LATEX GLOVES" PATIENT STATES "ONLY ALLERGIC TO THE POWDER INSIDE LATEX GLOVES" PATIENT STATES "ONLY ALLERGIC TO THE POWDER INSIDE LATEX GLOVES" PATIENT STATES "ONLY ALLERGIC TO THE POWDER INSIDE LATEX GLOVES" PATIENT STATES "ONLY ALLERGIC TO THE POWDER INSIDE LATEX GLOVES"   . Penicillins Anaphylaxis, Rash, Shortness Of Breath and Swelling    THROAT SWELLING THROAT SWELLING THROAT SWELLING THROAT SWELLING THROAT SWELLING THROAT SWELLING THROAT SWELLING THROAT SWELLING THROAT SWELLING THROAT SWELLING THROAT SWELLING THROAT SWELLING THROAT SWELLING THROAT SWELLING THROAT SWELLING THROAT SWELLING   . Hydrocodone-Acetaminophen Swelling    Other reaction(s): Other (See Comments), other/intolerance HEART RACING HEART RACING Other reaction(s): other/intolerance HEART RACING HEART RACING Other reaction(s): other/intolerance Other reaction(s): other/intolerance HEART RACING HEART RACING HEART RACING HEART RACING HEART RACING HEART RACING Other reaction(s): other/intolerance HEART RACING HEART RACING Other reaction(s): other/intolerance Other reaction(s): other/intolerance HEART RACING HEART RACING HEART RACING HEART RACING HEART RACING Other reaction(s):  other/intolerance HEART RACING HEART RACING HEART RACING Other reaction(s): other/intolerance Other reaction(s): other/intolerance HEART RACING HEART RACING HEART RACING HEART RACING   . Methocarbamol Itching and Rash  . Methylprednisolone Hives, Itching and Rash  . Oxycodone-Acetaminophen Itching, Rash and Hives   Patient Active Problem List   Diagnosis Date Noted  . S/P bariatric surgery 03/12/2021  . Obesity 03/12/2021  . Chronic back pain 03/12/2021   Social History   Socioeconomic History  . Marital status: Single    Spouse name: Not on file  . Number of children: Not on file  . Years of education: Not on file  . Highest education level: Not on file  Occupational History  . Not on file  Tobacco Use  . Smoking status: Never Smoker  . Smokeless tobacco: Never Used  Substance and Sexual Activity  . Alcohol use: Yes    Comment: Social  . Drug use: Never  . Sexual activity: Not on file  Other Topics Concern  . Not on file  Social History Narrative  . Not on file   Social Determinants of Health   Financial Resource Strain: Not on file  Food Insecurity: Not on file  Transportation Needs: Not on file  Physical Activity: Not on file  Stress: Not on file  Social Connections: Not on file  Intimate Partner Violence: Not on file    Ms. Colon Estrada's family history includes Asthma in her mother; Cancer - Other in her father; Hypercholesterolemia in her mother; Hypertension in her mother; Other in her  father.      Objective:    Vitals:   03/12/21 1532  BP: 100/70  Pulse: (!) 105    Physical Exam Well-developed well-nourished African-American female in no acute distress.  Pleasant height, Weight, 202  BMI 34.6  HEENT; nontraumatic normocephalic, EOMI, PE R LA, sclera anicteric. Oropharynx; not examined today Neck; supple, no JVD Cardiovascular; regular rate and rhythm with S1-S2, no murmur rub or gallop Pulmonary; Clear bilaterally Abdomen; soft,   nondistended, healed incisional ports, she has some mild periumbilical tenderness no appreciable hernia, no palpable mass or hepatosplenomegaly, bowel sounds are active Rectal; not done today Skin; benign exam, no jaundice rash or appreciable lesions Extremities; no clubbing cyanosis or edema skin warm and dry Neuro/Psych; alert and oriented x4, grossly nonfocal mood and affect appropriate       Assessment & Plan:   #42 45 year old African-American female who is about 3 and half months out from bariatric surgery with duodenal switch procedure done in [place].  Patient relocated to [facility] in mid January.  She started having episodes of severe abdominal pain in January and has had 4 discrete episodes thus far. These are associated with constant hard pain that doubles her over and last for about 2 hours.  No associated nausea vomiting fever chills diarrhea or melena.  In between these episodes she is doing fairly well with no ongoing pain.  Etiology of pain is not clear, consider biliary colic, rule out intermittent partial obstruction, consider marginal ulcer though feel less likely as pain is intermittent  #2 status post previous hysterectomy bilateral tubal ligation and C-section #3 status post cervical fusion and lumbar fusion with chronic back pain  Plan; CBC with differential, c-Met, lipase today Patient will be scheduled for CT scan of the abdomen pelvis with contrast. Stop as needed Protonix and start omeprazole 40 mg p.o. every morning.  Open capsule and sprinkle on yogurt or applesauce for better absorption. If CT scan is unrevealing and symptoms are persisting she will likely need EGD which will be scheduled with Dr. Bryan Lemma.  Patient will be established with Dr. Bryan Lemma. Patient was also advised today that should she have a more prolonged episode of pain that is not resolving to proceed to the emergency room for evaluation.    Beryle Bagsby S Arihana Ambrocio PA-C 03/12/2021   Cc:  Stana Bunting, PA-C

## 2021-03-12 NOTE — Patient Instructions (Signed)
If you are age 46 or older, your body mass index should be between 23-30. Your Body mass index is 34.67 kg/m. If this is out of the aforementioned range listed, please consider follow up with your Primary Care Provider.  If you are age 54 or younger, your body mass index should be between 19-25. Your Body mass index is 34.67 kg/m. If this is out of the aformentioned range listed, please consider follow up with your Primary Care Provider.   Your provider has requested that you go to the basement level for lab work before leaving today. Press "B" on the elevator. The lab is located at the first door on the left as you exit the elevator.  You have been scheduled for a CT scan of the abdomen and pelvis at [facility] ([address]---this is in the same building as [employer]).   You are scheduled on 03/21/2021 at 3:30 pm. You should arrive 15 minutes prior to your appointment time for registration. Please follow the written instructions below on the day of your exam:  WARNING: IF YOU ARE ALLERGIC TO IODINE/X-RAY DYE, PLEASE NOTIFY RADIOLOGY IMMEDIATELY AT [phone]! YOU WILL BE GIVEN A 13 HOUR PREMEDICATION PREP.  1) Do not eat or drink anything after 11:30 am (4 hours prior to your test) 2) You have been given 2 bottles of oral contrast to drink. The solution may taste better if refrigerated, but do NOT add ice or any other liquid to this solution. Shake well before drinking.    Drink 1 bottle of contrast @ 1:30 pm (2 hours prior to your exam)  Drink 1 bottle of contrast @ 2:30 pm (1 hour prior to your exam)  You may take any medications as prescribed with a small amount of water, if necessary. If you take any of the following medications: METFORMIN, GLUCOPHAGE, GLUCOVANCE, AVANDAMET, RIOMET, FORTAMET, [place] MET, JANUMET, GLUMETZA or METAGLIP, you MAY be asked to HOLD this medication 48 hours AFTER the exam.  The purpose of you drinking the oral contrast is to aid  in the visualization of your intestinal tract. The contrast solution may cause some diarrhea. Depending on your individual set of symptoms, you may also receive an intravenous injection of x-ray contrast/dye. Plan on being at [facility] for 30 minutes or longer, depending on the type of exam you are having performed.  This test typically takes 30-45 minutes to complete.  If you have any questions regarding your exam or if you need to reschedule, you may call the CT department at [phone] between the hours of 8:00 am and 5:00 pm, Monday-Friday.  _____________________________________________________________  Stop Pantoprazole  START Omeprazole 40 mg open 1 capsule every morning and sprinkle into applesauce or yogurt.  Follow up pending the results of you CT.  Thank you for entrusting me with your care and choosing [facility].  Amy Esterwood, PA-C

## 2021-03-20 NOTE — Progress Notes (Signed)
Agree with the assessment and plan as outlined by Amy Esterwood, PA-C.  Vito Cirigliano, DO, FACG  

## 2021-03-21 ENCOUNTER — Inpatient Hospital Stay: Admission: RE | Admit: 2021-03-21 | Payer: BC Managed Care – PPO | Source: Ambulatory Visit

## 2021-03-21 ENCOUNTER — Ambulatory Visit (INDEPENDENT_AMBULATORY_CARE_PROVIDER_SITE_OTHER)
Admission: RE | Admit: 2021-03-21 | Discharge: 2021-03-21 | Disposition: A | Discharge status: Home / Self Care | Payer: BC Managed Care – PPO | Source: Ambulatory Visit | Attending: Physician Assistant | Admitting: Physician Assistant

## 2021-03-21 ENCOUNTER — Other Ambulatory Visit: Payer: Self-pay

## 2021-03-21 DIAGNOSIS — R109 Unspecified abdominal pain: Secondary | ICD-10-CM

## 2021-03-21 DIAGNOSIS — Z9884 Bariatric surgery status: Secondary | ICD-10-CM

## 2021-03-21 MED ORDER — IOHEXOL 300 MG/ML  SOLN
100.0000 mL | Freq: Once | INTRAMUSCULAR | Status: AC | PRN
  Administered 2021-03-21: 100 mL via INTRAVENOUS

## 2021-03-24 NOTE — Telephone Encounter (Signed)
Formatting of this note might be different from the original.  74m sent please call patient to schedule for further refills  Electronically signed by Ina Homes APRN, FNP at 03/24/2021  8:16 AM CDT

## 2021-03-25 NOTE — Telephone Encounter (Signed)
Formatting of this note might be different from the original.  Pt advised  Electronically signed by Roanna Raider. at 03/25/2021  3:40 PM CDT

## 2021-03-25 NOTE — Telephone Encounter (Signed)
Formatting of this note might be different from the original.  Pt has moved to [place] and will be establishing care with a new dr. She wants to know if we can send 1 month of bupropion as well to give her time to find a new dr. Please call pt to advise.  Electronically signed by Syliva Overman B. at 03/25/2021 11:08 AM CDT

## 2021-03-25 NOTE — Telephone Encounter (Signed)
Formatting of this note might be different from the original.  79m sent  Electronically signed by Ina Homes APRN, FNP at 03/25/2021 11:11 AM CDT

## 2021-08-29 ENCOUNTER — Ambulatory Visit: Payer: BC Managed Care – PPO | Attending: Physician Assistant

## 2021-08-29 DIAGNOSIS — M545 Low back pain, unspecified: Secondary | ICD-10-CM | POA: Insufficient documentation

## 2021-08-29 DIAGNOSIS — G8929 Other chronic pain: Secondary | ICD-10-CM | POA: Insufficient documentation

## 2021-09-12 ENCOUNTER — Other Ambulatory Visit: Payer: Self-pay

## 2021-09-12 ENCOUNTER — Ambulatory Visit: Payer: BC Managed Care – PPO

## 2021-09-12 ENCOUNTER — Encounter: Payer: Self-pay | Admitting: Gastroenterology

## 2021-09-12 DIAGNOSIS — M545 Low back pain, unspecified: Secondary | ICD-10-CM

## 2021-09-12 DIAGNOSIS — G8929 Other chronic pain: Secondary | ICD-10-CM

## 2021-09-12 NOTE — Therapy (Signed)
[facility] [place], [place], [address] Phone: [phone]   Fax:  [phone]  Patient Details  Name: Rhonda Guerrero [name] MRN: [id number] Date of Birth: Apr 25, 1976 Referring Provider:  Stana Bunting, PA-C  Encounter Date: 09/12/2021          Today Ms [name] arrived for FCE assessment and after reviewing her history and assessing job demands af full FCE is not warrented.  She does no lifting /carrying  or pushing /pulling . She requires no sustained position tolerance except sitting.  She does not have any mobility needs except to walk to get in /out of work and for breaks. She has a standing desk and a chair she has purchased.  She was able to sit in the clinic for 30 min before pain increased and tolerated 45 min  sitting.  I filled out her form as able and left blank items I am unable to make a judgement for.   I think a round of formal PT may help with her reported hip pain. I also demo with her a decompression brace and use of this may be of some benefit with sitting and standing. If a full FCE is needed I wlll be happy to bring her back and do the exam .  I will be glad to talk if needed but I am only in the clinic to do this testing. You can leave a  message and I will get back as soon as possible if needed.   Darrel Hoover  PT 09/12/2021, 8:09 AM  [facility] [address] [place], [place], [address] Phone: [phone]   Fax:  [phone]

## 2021-10-14 ENCOUNTER — Other Ambulatory Visit: Payer: Self-pay

## 2021-10-14 ENCOUNTER — Ambulatory Visit (AMBULATORY_SURGERY_CENTER): Payer: BC Managed Care – PPO

## 2021-10-14 VITALS — Ht 64.0 in | Wt 175.0 lb

## 2021-10-14 DIAGNOSIS — Z1211 Encounter for screening for malignant neoplasm of colon: Secondary | ICD-10-CM

## 2021-10-14 MED ORDER — PEG-KCL-NACL-NASULF-NA ASC-C 100 G PO SOLR: 1.0000 | Freq: Once | ORAL | 0 refills | Status: AC

## 2021-10-14 NOTE — Progress Notes (Signed)
  Patient's pre-visit was done today over the phone with the patient   Name,DOB and address verified.   Patient denies any allergies to Eggs and Soy.  Patient denies any problems with anesthesia/sedation. Patient denies taking diet pills or blood thinners.  Denies atrial flutter or atrial fib Denies chronic constipation now goes everyday No home Oxygen.   Packet of Prep instructions mailed to patient including a copy of a consent form-pt is aware.  Patient understands to call us back with any questions or concerns.  Patient is aware of our care-partner policy and [id number] safety protocol.   Told to bring rescue inhaler the day of the procedure.  No restrictions in movements  Pt is working during [employer] and may be distracted from listening.

## 2021-10-15 ENCOUNTER — Encounter: Payer: Self-pay | Admitting: Gastroenterology

## 2021-10-25 ENCOUNTER — Other Ambulatory Visit: Payer: Self-pay | Admitting: Physician Assistant

## 2021-10-29 ENCOUNTER — Ambulatory Visit (AMBULATORY_SURGERY_CENTER): Payer: BC Managed Care – PPO | Admitting: Gastroenterology

## 2021-10-29 ENCOUNTER — Encounter: Payer: Self-pay | Admitting: Gastroenterology

## 2021-10-29 VITALS — BP 111/80 | HR 74 | Temp 98.6°F | Resp 14 | Ht 64.0 in | Wt 175.0 lb

## 2021-10-29 DIAGNOSIS — D122 Benign neoplasm of ascending colon: Secondary | ICD-10-CM | POA: Diagnosis not present

## 2021-10-29 DIAGNOSIS — Z1211 Encounter for screening for malignant neoplasm of colon: Secondary | ICD-10-CM | POA: Diagnosis not present

## 2021-10-29 MED ORDER — SODIUM CHLORIDE 0.9 % IV SOLN: 500.0000 mL | Freq: Once | INTRAVENOUS | Status: DC

## 2021-10-29 NOTE — Progress Notes (Signed)
VS  DT ? ?Pt's states no medical or surgical changes since previsit or office visit. ? ?

## 2021-10-29 NOTE — Patient Instructions (Signed)
Handouts provided on polyps and diverticulosis.  ? ?YOU HAD AN ENDOSCOPIC PROCEDURE TODAY AT THE [facility]:   Refer to the procedure report that was given to you for any specific questions about what was found during the examination.  If the procedure report does not answer your questions, please call your gastroenterologist to clarify.  If you requested that your care partner not be given the details of your procedure findings, then the procedure report has been included in a sealed envelope for you to review at your convenience later. ? ?YOU SHOULD EXPECT: Some feelings of bloating in the abdomen. Passage of more gas than usual.  Walking can help get rid of the air that was put into your GI tract during the procedure and reduce the bloating. If you had a lower endoscopy (such as a colonoscopy or flexible sigmoidoscopy) you may notice spotting of blood in your stool or on the toilet paper. If you underwent a bowel prep for your procedure, you may not have a normal bowel movement for a few days. ? ?Please Note:  You might notice some irritation and congestion in your nose or some drainage.  This is from the oxygen used during your procedure.  There is no need for concern and it should clear up in a day or so. ? ?SYMPTOMS TO REPORT IMMEDIATELY: ? ?Following lower endoscopy (colonoscopy or flexible sigmoidoscopy): ? Excessive amounts of blood in the stool ? Significant tenderness or worsening of abdominal pains ? Swelling of the abdomen that is new, acute ? Fever of 100?F or higher ? ?For urgent or emergent issues, a gastroenterologist can be reached at any hour by calling [phone]. ?Do not use MyChart messaging for urgent concerns.  ? ? ?DIET:  We do recommend a small meal at first, but then you may proceed to your regular diet.  Drink plenty of fluids but you should avoid alcoholic beverages for 24 hours. ? ?ACTIVITY:  You should plan to take it easy for the rest of today and you should NOT  DRIVE or use heavy machinery until tomorrow (because of the sedation medicines used during the test).   ? ?FOLLOW UP: ?Our staff will call the number listed on your records 48-72 hours following your procedure to check on you and address any questions or concerns that you may have regarding the information given to you following your procedure. If we do not reach you, we will leave a message.  We will attempt to reach you two times.  During this call, we will ask if you have developed any symptoms of COVID 19. If you develop any symptoms (ie: fever, flu-like symptoms, shortness of breath, cough etc.) before then, please call [phone].  If you test positive for Covid 19 in the 2 weeks post procedure, please call and report this information to us.   ? ?If any biopsies were taken you will be contacted by phone or by letter within the next 1-3 weeks.  Please call us at [phone] if you have not heard about the biopsies in 3 weeks.  ? ? ?SIGNATURES/CONFIDENTIALITY: ?You and/or your care partner have signed paperwork which will be entered into your electronic medical record.  These signatures attest to the fact that that the information above on your After Visit Summary has been reviewed and is understood.  Full responsibility of the confidentiality of this discharge information lies with you and/or your care-partner. ? ?

## 2021-10-29 NOTE — Op Note (Signed)
[facility] Patient Name: Rhonda Guerrero [name] Procedure Date: 10/29/2021 12:12 PM MRN: [id number] Endoscopist: Mallie Mussel L. Loletha Carrow , MD Age: 45 Referring MD:  Date of Birth: 1976-08-19 Gender: Female Account #: [id number] Procedure:                Colonoscopy Indications:              Screening for colorectal malignant neoplasm, This                            is the patient's first colonoscopy Medicines:                Monitored Anesthesia Care Procedure:                Pre-Anesthesia Assessment:                           - Prior to the procedure, a History and Physical                            was performed, and patient medications and                            allergies were reviewed. The patient's tolerance of                            previous anesthesia was also reviewed. The risks                            and benefits of the procedure and the sedation                            options and risks were discussed with the patient.                            All questions were answered, and informed consent                            was obtained. Prior Anticoagulants: The patient has                            taken no previous anticoagulant or antiplatelet                            agents. ASA Grade Assessment: II - A patient with                            mild systemic disease. After reviewing the risks                            and benefits, the patient was deemed in                            satisfactory condition to undergo the procedure.  After obtaining informed consent, the colonoscope                            was passed under direct vision. Throughout the                            procedure, the patient's blood pressure, pulse, and                            oxygen saturations were monitored continuously. The                            Olympus CF-HQ190L [id number]) Colonoscope was                            introduced through the  anus and advanced to the the                            cecum, identified by appendiceal orifice and                            ileocecal valve. The colonoscopy was somewhat                            difficult due to a redundant Guerrero. Successful                            completion of the procedure was aided by                            straightening and shortening the scope to obtain                            bowel loop reduction. The patient tolerated the                            procedure well. The quality of the bowel                            preparation was good. The ileocecal valve,                            appendiceal orifice, and rectum were photographed.                            The bowel preparation used was Plenvu. Scope In: 12:23:36 PM Scope Out: 12:39:09 PM Scope Withdrawal Time: 0 hours 10 minutes 50 seconds  Total Procedure Duration: 0 hours 15 minutes 33 seconds  Findings:                 The perianal and digital rectal examinations were                            normal.  A diminutive polyp was found in the hepatic                            flexure. The polyp was semi-sessile. The polyp was                            removed with a cold snare. Resection and retrieval                            were complete.                           Multiple small-mouthed diverticula were found in                            the left Guerrero and right Guerrero.                           The exam was otherwise without abnormality on                            direct and retroflexion views. Complications:            No immediate complications. Estimated Blood Loss:     Estimated blood loss was minimal. Impression:               - One diminutive polyp at the hepatic flexure,                            removed with a cold snare. Resected and retrieved.                           - Diverticulosis in the left Guerrero and in the right                             Guerrero.                           - The examination was otherwise normal on direct                            and retroflexion views. Recommendation:           - Patient has a contact number available for                            emergencies. The signs and symptoms of potential                            delayed complications were discussed with the                            patient. Return to normal activities tomorrow.                            Written discharge instructions were  provided to the                            patient.                           - Resume previous diet.                           - Continue present medications.                           - Await pathology results.                           - Repeat colonoscopy is recommended for                            surveillance. The colonoscopy date will be                            determined after pathology results from today's                            exam become available for review. (Dr. Bryan Lemma                            is primary GI for this patient) Mallie Mussel L. Loletha Carrow, MD 10/29/2021 12:43:16 PM This report has been signed electronically.

## 2021-10-29 NOTE — Progress Notes (Signed)
History and Physical:  This patient presents for endoscopic testing for: Encounter Diagnoses  Name Primary?   Special screening for malignant neoplasms, colon Yes   Abdominal pain, unspecified abdominal location    Patient was seen in clinic [place] by APP and supervised by Dr. Bryan Lemma Here today for screening colonoscopy  ROS: Patient denies chest pain or cough   Past Medical History: Past Medical History:  Diagnosis Date   Asthma    Blood transfusion without reported diagnosis    With bone surgery in 2010   GERD (gastroesophageal reflux disease)    Hypercholesterolemia    Insomnia    Mixed anxiety and depressive disorder      Past Surgical History: Past Surgical History:  Procedure Laterality Date   ABDOMINAL HYSTERECTOMY     BARIATRIC SURGERY     Gastric sleeve and duodenal switch   CESAREAN SECTION     SPINE SURGERY     TUBAL LIGATION      Allergies: Allergies  Allergen Reactions   Latex Itching, Rash, Shortness Of Breath and Swelling    PATIENT STATES "ONLY ALLERGIC TO THE POWDER INSIDE LATEX GLOVES" PATIENT STATES "ONLY ALLERGIC TO THE POWDER INSIDE LATEX GLOVES" PATIENT STATES "ONLY ALLERGIC TO THE POWDER INSIDE LATEX GLOVES" PATIENT STATES "ONLY ALLERGIC TO THE POWDER INSIDE LATEX GLOVES" PATIENT STATES "ONLY ALLERGIC TO THE POWDER INSIDE LATEX GLOVES" PATIENT STATES "ONLY ALLERGIC TO THE POWDER INSIDE LATEX GLOVES" PATIENT STATES "ONLY ALLERGIC TO THE POWDER INSIDE LATEX GLOVES" PATIENT STATES "ONLY ALLERGIC TO THE POWDER INSIDE LATEX GLOVES" PATIENT STATES "ONLY ALLERGIC TO THE POWDER INSIDE LATEX GLOVES" PATIENT STATES "ONLY ALLERGIC TO THE POWDER INSIDE LATEX GLOVES" PATIENT STATES "ONLY ALLERGIC TO THE POWDER INSIDE LATEX GLOVES" PATIENT STATES "ONLY ALLERGIC TO THE POWDER INSIDE LATEX GLOVES" PATIENT STATES "ONLY ALLERGIC TO THE POWDER INSIDE LATEX GLOVES" PATIENT STATES "ONLY ALLERGIC TO THE POWDER INSIDE LATEX GLOVES" PATIENT STATES "ONLY  ALLERGIC TO THE POWDER INSIDE LATEX GLOVES" PATIENT STATES "ONLY ALLERGIC TO THE POWDER INSIDE LATEX GLOVES" PATIENT STATES "ONLY ALLERGIC TO THE POWDER INSIDE LATEX GLOVES" PATIENT STATES "ONLY ALLERGIC TO THE POWDER INSIDE LATEX GLOVES" PATIENT STATES "ONLY ALLERGIC TO THE POWDER INSIDE LATEX GLOVES" PATIENT STATES "ONLY ALLERGIC TO THE POWDER INSIDE LATEX GLOVES" PATIENT STATES "ONLY ALLERGIC TO THE POWDER INSIDE LATEX GLOVES" PATIENT STATES "ONLY ALLERGIC TO THE POWDER INSIDE LATEX GLOVES" PATIENT STATES "ONLY ALLERGIC TO THE POWDER INSIDE LATEX GLOVES" PATIENT STATES "ONLY ALLERGIC TO THE POWDER INSIDE LATEX GLOVES"    Penicillins Anaphylaxis, Rash, Shortness Of Breath and Swelling    THROAT SWELLING THROAT SWELLING THROAT SWELLING THROAT SWELLING THROAT SWELLING THROAT SWELLING THROAT SWELLING THROAT SWELLING THROAT SWELLING THROAT SWELLING THROAT SWELLING THROAT SWELLING THROAT SWELLING THROAT SWELLING THROAT SWELLING THROAT SWELLING    Hydrocodone-Acetaminophen Swelling    Other reaction(s): Other (See Comments), other/intolerance HEART RACING HEART RACING Other reaction(s): other/intolerance HEART RACING HEART RACING Other reaction(s): other/intolerance Other reaction(s): other/intolerance HEART RACING HEART RACING HEART RACING HEART RACING HEART RACING HEART RACING Other reaction(s): other/intolerance HEART RACING HEART RACING Other reaction(s): other/intolerance Other reaction(s): other/intolerance HEART RACING HEART RACING HEART RACING HEART RACING HEART RACING Other reaction(s): other/intolerance HEART RACING HEART RACING HEART RACING Other reaction(s): other/intolerance Other reaction(s): other/intolerance HEART RACING HEART RACING HEART RACING HEART RACING    Methocarbamol Itching and Rash   Methylprednisolone Hives, Itching and Rash   Oxycodone-Acetaminophen Itching, Rash and Hives    Outpatient Meds: Current Outpatient  Medications  Medication Sig Dispense Refill   acetaminophen (  TYLENOL) 325 MG tablet Take by mouth.     buPROPion (WELLBUTRIN XL) 150 MG 24 hr tablet Take 1 tablet by mouth daily.     cyclobenzaprine (FLEXERIL) 10 MG tablet Take 1 tablet by mouth at bedtime.     escitalopram (LEXAPRO) 20 MG tablet Take 1 tablet by mouth daily.     Lactobacillus Rhamnosus, GG, (RA PROBIOTIC DIGESTIVE CARE) CAPS Take 1 capsule by mouth daily.     Multiple Vitamins-Minerals (MULTIVITAL PO) Take by mouth.     albuterol (VENTOLIN HFA) 108 (90 Base) MCG/ACT inhaler Inhale 2 puffs into the lungs 4 (four) times daily as needed.     ascorbic acid (VITAMIN C) 1000 MG tablet Take by mouth.     omeprazole (PRILOSEC) 40 MG capsule OPEN 1 CAPSULE AND SPRINKLE INTO APPLESAUCE EVERY MORNING. 90 capsule 2   traMADol (ULTRAM) 50 MG tablet Take 50 mg by mouth 2 (two) times daily as needed.     Current Facility-Administered Medications  Medication Dose Route Frequency Provider Last Rate Last Admin   0.9 %  sodium chloride infusion  500 mL Intravenous Once Danis, Estill Cotta III, MD          ___________________________________________________________________ Objective   Exam:  BP (!) 104/56   Pulse 100   Temp 98.6 F (37 C) (Temporal)   Ht 5\' 4"  (1.626 m)   Wt 175 lb (79.4 kg)   SpO2 98%   BMI 30.04 kg/m   CV: RRR without murmur, S1/S2 Resp: clear to auscultation bilaterally, normal RR and effort noted GI: soft, no tenderness, with active bowel sounds.   Assessment: Encounter Diagnoses  Name Primary?   Special screening for malignant neoplasms, colon Yes   Abdominal pain, unspecified abdominal location      Plan: Colonoscopy  The benefits and risks of the planned procedure were described in detail with the patient or (when appropriate) their health care proxy.  Risks were outlined as including, but not limited to, bleeding, infection, perforation, adverse medication reaction leading to cardiac or pulmonary  decompensation, pancreatitis (if ERCP).  The limitation of incomplete mucosal visualization was also discussed.  No guarantees or warranties were given.    The patient is appropriate for an endoscopic procedure in the ambulatory setting.   - Wilfrid Lund, MD

## 2021-10-29 NOTE — Progress Notes (Signed)
Called to room to assist during endoscopic procedure.  Patient ID and intended procedure confirmed with present staff. Received instructions for my participation in the procedure from the performing physician.  

## 2021-10-29 NOTE — Progress Notes (Signed)
A and O x3. Report to RN. Tolerated MAC anesthesia well. 

## 2021-10-31 ENCOUNTER — Telehealth: Payer: Self-pay

## 2021-10-31 NOTE — Telephone Encounter (Signed)
  Follow up Call-  Call back number 10/29/2021  Post procedure Call Back phone  # [phone]  Permission to leave phone message Yes     Patient questions:  Do you have a fever, pain , or abdominal swelling? No. Pain Score  0 *  Have you tolerated food without any problems? Yes.    Have you been able to return to your normal activities? Yes.    Do you have any questions about your discharge instructions: Diet   No. Medications  No. Follow up visit  No.  Do you have questions or concerns about your Care? No.  Actions: * If pain score is 4 or above: No action needed, pain <4.  Have you developed a fever since your procedure? no  2.   Have you had an respiratory symptoms (SOB or cough) since your procedure? no  3.   Have you tested positive for COVID 19 since your procedure? no  4.   Have you had any family members/close contacts diagnosed with the COVID 19 since your procedure?  no   If yes to any of these questions please route to Joylene [name], RN and Joella Prince, RN

## 2021-11-02 ENCOUNTER — Other Ambulatory Visit: Payer: Self-pay

## 2021-11-02 ENCOUNTER — Ambulatory Visit
Admission: EM | Admit: 2021-11-02 | Discharge: 2021-11-02 | Disposition: A | Discharge status: Home / Self Care | Payer: BC Managed Care – PPO | Attending: Internal Medicine | Admitting: Internal Medicine

## 2021-11-02 DIAGNOSIS — J069 Acute upper respiratory infection, unspecified: Secondary | ICD-10-CM | POA: Diagnosis not present

## 2021-11-02 DIAGNOSIS — Z113 Encounter for screening for infections with a predominantly sexual mode of transmission: Secondary | ICD-10-CM | POA: Diagnosis not present

## 2021-11-02 DIAGNOSIS — Z20822 Contact with and (suspected) exposure to covid-19: Secondary | ICD-10-CM | POA: Diagnosis not present

## 2021-11-02 DIAGNOSIS — N898 Other specified noninflammatory disorders of vagina: Secondary | ICD-10-CM

## 2021-11-02 MED ORDER — BENZONATATE 100 MG PO CAPS: 100.0000 mg | ORAL_CAPSULE | Freq: Three times a day (TID) | ORAL | 0 refills | Status: DC | PRN

## 2021-11-02 MED ORDER — FLUCONAZOLE 150 MG PO TABS: 150.0000 mg | ORAL_TABLET | Freq: Every day | ORAL | 0 refills | Status: DC

## 2021-11-02 NOTE — ED Triage Notes (Addendum)
One week h/o intermittent HA and 6 days of cough, fatigue and rhinorrhea. Pt denies sore throat but complains of "weird"sensation.  Has been taking alka seltzer w/o relief. Also taking benadryl with sinus pressure and tylenol with some relief. Yolanda Bonine and daughter sick last week.  Approx 3 weeks ago, Pt had her wisdom teeth removed and thinks that she may have had antibiotics via IV. Notes onset of vaginal itching and somewhat  more discharge than normal for her within the last week. Denies urinary sxs, vaginal odor and irritation. Used Monistat 1 day yesterday afternoon.

## 2021-11-02 NOTE — ED Provider Notes (Signed)
[facility]    CSN: [id number] Arrival date & time: 11/02/21  3785      History   Chief Complaint Chief Complaint  Patient presents with   Cough   Headache   Vaginal Itching    HPI Rhonda Guerrero is a 45 y.o. female.   Patient presents with cough, runny nose, headache that has been present for approximately 6 days.  Patient reports the cough is dry.  Two family members have had similar symptoms recently.  Patient denies any known fevers.  Denies chest pain or shortness of breath.  Patient does have history of asthma and uses albuterol inhaler as needed.  Patient denies that she has not had to use albuterol inhaler during sickness.  Has been taking Benadryl, Alka-Seltzer cold and flu, Tylenol with minimal improvement in symptoms.  Patient also has white vaginal discharge that started a few days prior.  Patient reports that she was recently on antibiotics for dental surgery and believes that may be a yeast infection.  Denies any known exposure to STD but has had unprotected sexual intercourse recently.  Patient no longer has menstrual cycles as she has had a recent hysterectomy.  Denies any urinary burning, urinary frequency, irregular vaginal bleeding, pelvic pain, abdominal pain, fever, back pain.   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      Home Medications    Prior to Admission medications   Medication Sig Start Date End Date Taking? Authorizing Provider  benzonatate (TESSALON) 100 MG capsule Take 1 capsule (100 mg total) by mouth every 8 (eight) hours as needed for cough. 11/02/21  Yes Annise Boran, Hildred Alamin E, FNP  fluconazole (DIFLUCAN) 150 MG tablet Take 1 tablet (150 mg total) by mouth daily. 11/02/21  Yes Elis Rawlinson, Hildred Alamin E, FNP  acetaminophen (TYLENOL) 325 MG tablet Take by mouth.    [provider]  albuterol (VENTOLIN HFA) 108 (90 Base) MCG/ACT inhaler Inhale 2 puffs into the lungs 4 (four) times daily as needed. 02/27/10   [provider]  ascorbic acid (VITAMIN C) 1000 MG tablet Take by mouth.    [provider]  buPROPion (WELLBUTRIN XL) 150 MG 24 hr tablet Take 1 tablet by mouth daily. 10/01/14   [provider]  cyclobenzaprine (FLEXERIL) 10 MG tablet Take 1 tablet by mouth at bedtime. 10/12/19   [provider]  escitalopram (LEXAPRO) 20 MG tablet Take 1 tablet by mouth daily. 10/01/14   [provider]  Lactobacillus Rhamnosus, GG, (RA PROBIOTIC DIGESTIVE CARE) CAPS Take 1 capsule by mouth daily.    [provider]  Multiple Vitamins-Minerals (MULTIVITAL PO) Take by mouth.    [provider]  omeprazole (PRILOSEC) 40 MG capsule OPEN 1 CAPSULE AND SPRINKLE INTO APPLESAUCE EVERY MORNING. 10/27/21   Esterwood, Amy S, PA-C  traMADol (ULTRAM) 50 MG tablet Take 50 mg by mouth 2 (two) times daily as needed. 09/15/21   [provider]    Family History Family History  Problem Relation Age of Onset   Colon polyps Mother    Asthma Mother    Hypertension Mother    Hypercholesterolemia Mother    Esophageal cancer Father    Other Father        Malignant Tumor of Lung   Cancer - Other Father        Pharangeal carcinoma   Colon cancer Neg Hx    Rectal cancer Neg Hx    Stomach cancer Neg Hx     Social  History Social History   Tobacco Use   Smoking status: Never   Smokeless tobacco: Never  Vaping Use   Vaping Use: Some days  Substance Use Topics   Alcohol use: Yes    Comment: Social   Drug use: Never     Allergies   Latex, Penicillins, Hydrocodone-acetaminophen, Methocarbamol, Methylprednisolone, and Oxycodone-acetaminophen   Review of Systems Review of Systems Per HPI  Physical Exam Triage Vital Signs ED Triage Vitals  Enc Vitals Group     BP 11/02/21 1059 105/69     Pulse Rate 11/02/21 1059 77     Resp 11/02/21 1059 18     Temp 11/02/21 1059 98.2 F (36.8 C)     Temp Source 11/02/21 1059 Oral     SpO2 11/02/21 1059 98 %     Weight --      Height --      Head Circumference --      Peak Flow --      Pain Score 11/02/21 1103 4     Pain Loc --      Pain Edu? --      Excl. in [place]? --    No data found.  Updated Vital Signs BP 105/69 (BP Location: Left Arm)   Pulse 77   Temp 98.2 F (36.8 C) (Oral)   Resp 18   SpO2 98%   Visual Acuity Right Eye Distance:   Left Eye Distance:   Bilateral Distance:    Right Eye Near:   Left Eye Near:    Bilateral Near:     Physical Exam Constitutional:      General: She is not in acute distress.    Appearance: Normal appearance. She is not toxic-appearing or diaphoretic.  HENT:     Head: Normocephalic and atraumatic.     Right Ear: Tympanic membrane and ear canal normal.     Left Ear: Tympanic membrane and ear canal normal.     Nose: Congestion present.     Mouth/Throat:     Mouth: Mucous membranes are moist.     Pharynx: No posterior oropharyngeal erythema.  Eyes:     Extraocular Movements: Extraocular movements intact.     Conjunctiva/sclera: Conjunctivae normal.     Pupils: Pupils are equal, round, and reactive to light.  Cardiovascular:     Rate and Rhythm: Normal rate and regular rhythm.     Pulses: Normal pulses.     Heart sounds: Normal heart sounds.  Pulmonary:     Effort: Pulmonary effort is  normal. No respiratory distress.     Breath sounds: Normal breath sounds. No stridor. No wheezing, rhonchi or rales.  Abdominal:  General: Abdomen is flat. Bowel sounds are normal.     Palpations: Abdomen is soft.  Genitourinary:    Comments: Deferred with shared decision making.  Self swab performed. Musculoskeletal:        General: Normal range of motion.     Cervical back: Normal range of motion.  Skin:    General: Skin is warm and dry.  Neurological:     General: No focal deficit present.     Mental Status: She is alert and oriented to person, place, and time. Mental status is at baseline.  Psychiatric:        Mood and Affect: Mood normal.        Behavior: Behavior normal.     UC Treatments / Results  Labs (all labs ordered are listed, but only abnormal results are displayed) Labs Reviewed  COVID-19, FLU A+B NAA  CERVICOVAGINAL ANCILLARY ONLY    EKG   Radiology No results found.  Procedures Procedures (including critical care time)  Medications Ordered in UC Medications - No data to display  Initial Impression / Assessment and Plan / UC Course  I have reviewed the triage vital signs and the nursing notes.  Pertinent labs & imaging results that were available during my care of the patient were reviewed by me and considered in my medical decision making (see chart for details).     Patient presents with symptoms likely from a viral upper respiratory infection. Differential includes bacterial pneumonia, sinusitis, allergic rhinitis, Covid 19. Do not suspect underlying cardiopulmonary process. Symptoms seem unlikely related to ACS, CHF or COPD exacerbations, pneumonia, pneumothorax. Patient is nontoxic appearing and not in need of emergent medical intervention.  Do not suspect asthma exacerbation as lung sounds are clear, patient does not report shortness of breath, and symptoms are mild.  COVID-19 and flu test is pending.  Recommended symptom control with over  the counter medications: Daily oral anti-histamine, Oral decongestant or IN corticosteroid, saline irrigations, cepacol lozenges, Robitussin, Delsym, honey tea.  Benzonatate to take as needed for cough.  Unable to prescribe Flonase as patient has allergy contraindication.  Patient already taking cetirizine.  Vaginal discharge most likely related to vaginal yeast as patient recently had antibiotic treatment.  Will prescribe Diflucan.  Vaginal swab pending. Will review results for further treatment.  No red flags on exam.  Return if symptoms fail to improve in 1-2 weeks or you develop shortness of breath, chest pain, severe headache. Patient states understanding and is agreeable.  Discharged with PCP followup.  Final Clinical Impressions(s) / UC Diagnoses   Final diagnoses:  Viral upper respiratory tract infection with cough  Vaginal discharge  Screening examination for venereal disease  Encounter for laboratory testing for COVID-19 virus     Discharge Instructions      It appears that you have a viral upper respiratory infection that should resolve in the next few days.  You have been prescribed cough medication to take as needed.  Diflucan has also been prescribed to help with your vaginal discharge.  Your COVID-19 and flu test are pending as well as your vaginal swab.  We will call if there are any abnormalities.  Continue over-the-counter medications in addition as well.  Unable to prescribe Flonase as there is an allergy contraindication.     ED Prescriptions     Medication Sig Dispense Auth. Provider   fluconazole (DIFLUCAN) 150 MG tablet Take 1 tablet (150 mg total) by mouth daily. 1 tablet [place], Michele Rockers, [place]  benzonatate (TESSALON) 100 MG capsule Take 1 capsule (100 mg total) by mouth every 8 (eight) hours as needed for cough. 21 capsule [place], Michele Rockers, [place]      PDMP not reviewed this encounter.   Teodora Medici, [place] 11/02/21 1141

## 2021-11-02 NOTE — Discharge Instructions (Signed)
It appears that you have a viral upper respiratory infection that should resolve in the next few days.  You have been prescribed cough medication to take as needed.  Diflucan has also been prescribed to help with your vaginal discharge.  Your COVID-19 and flu test are pending as well as your vaginal swab.  We will call if there are any abnormalities.  Continue over-the-counter medications in addition as well.  Unable to prescribe Flonase as there is an allergy contraindication.

## 2021-11-03 ENCOUNTER — Encounter: Payer: Self-pay | Admitting: Gastroenterology

## 2021-11-03 LAB — COVID-19, FLU A+B NAA
Influenza A, NAA: DETECTED — AB
Influenza B, NAA: NOT DETECTED
SARS-CoV-2, NAA: NOT DETECTED

## 2021-11-03 LAB — CERVICOVAGINAL ANCILLARY ONLY
Bacterial Vaginitis (gardnerella): NEGATIVE
Candida Glabrata: NEGATIVE
Candida Vaginitis: POSITIVE — AB
Chlamydia: NEGATIVE
Comment: NEGATIVE
Comment: NEGATIVE
Comment: NEGATIVE
Comment: NEGATIVE
Comment: NEGATIVE
Comment: NORMAL
Neisseria Gonorrhea: NEGATIVE
Trichomonas: NEGATIVE

## 2021-12-18 ENCOUNTER — Encounter: Payer: Self-pay | Admitting: Emergency Medicine

## 2021-12-18 ENCOUNTER — Ambulatory Visit
Admission: EM | Admit: 2021-12-18 | Discharge: 2021-12-18 | Disposition: A | Discharge status: Home / Self Care | Payer: BC Managed Care – PPO

## 2021-12-18 ENCOUNTER — Other Ambulatory Visit: Payer: Self-pay

## 2021-12-18 DIAGNOSIS — H538 Other visual disturbances: Secondary | ICD-10-CM | POA: Diagnosis not present

## 2021-12-18 DIAGNOSIS — H5789 Other specified disorders of eye and adnexa: Secondary | ICD-10-CM

## 2021-12-18 NOTE — ED Provider Notes (Signed)
[facility]    CSN: [id number] Arrival date & time: 12/18/21  0805      History   Chief Complaint Chief Complaint  Patient presents with   Eye Problem    HPI Rhonda Guerrero is a 45 y.o. female.   Patient presents with bilateral eye redness and pain that started yesterday after getting her eyelashes done.  Patient reports that she was having some burning pain while getting her eyelashes done, and pain intensified afterward.  She removed her eyelashes and cleaned her face and eyes but symptoms have been persistent.  She describes the pain as "it feels like someone hit me in the face".  She is also having some blurry vision in the left eye.  She does wear contacts but has not worn them for quite some time.  Is having some clear drainage from both eyes but no purulent drainage.  Denies any trauma or foreign bodies to the eye.  Denies any upper respiratory symptoms or fever.  Denies pain with extraocular movements.   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      Home Medications    Prior to Admission medications   Medication Sig Start Date End Date Taking? Authorizing Provider  acetaminophen (TYLENOL) 325 MG tablet Take by mouth.    [provider]  albuterol (VENTOLIN HFA) 108 (90 Base) MCG/ACT inhaler Inhale 2  puffs into the lungs 4 (four) times daily as needed. 02/27/10   [provider]  ascorbic acid (VITAMIN C) 1000 MG tablet Take by mouth.    [provider]  benzonatate (TESSALON) 100 MG capsule Take 1 capsule (100 mg total) by mouth every 8 (eight) hours as needed for cough. 11/02/21   Teodora Medici, FNP  buPROPion (WELLBUTRIN XL) 150 MG 24 hr tablet Take 1 tablet by mouth daily. 10/01/14   [provider]  cyclobenzaprine (FLEXERIL) 10 MG tablet Take 1 tablet by mouth at bedtime. 10/12/19   [provider]  escitalopram (LEXAPRO) 20 MG tablet Take 1 tablet by mouth daily. 10/01/14   [provider]  fluconazole (DIFLUCAN) 150 MG tablet Take 1 tablet (150 mg total) by mouth daily. 11/02/21   Teodora Medici, FNP  Lactobacillus Rhamnosus, GG, (RA PROBIOTIC DIGESTIVE CARE) CAPS Take 1 capsule by mouth daily.    [provider]  Multiple Vitamins-Minerals (MULTIVITAL PO) Take by mouth.    [provider]  omeprazole (PRILOSEC) 40 MG capsule OPEN 1 CAPSULE AND SPRINKLE INTO APPLESAUCE EVERY MORNING. 10/27/21   Esterwood, Amy S, PA-C  traMADol (ULTRAM) 50 MG tablet Take 50 mg by mouth 2 (two) times daily as needed. 09/15/21   [provider]  Family History Family History  Problem Relation Age of Onset   Colon polyps Mother    Asthma Mother    Hypertension Mother    Hypercholesterolemia Mother    Esophageal cancer Father    Other Father        Malignant Tumor of Lung   Cancer - Other Father        Pharangeal carcinoma   Colon cancer Neg Hx    Rectal cancer Neg Hx    Stomach cancer Neg Hx     Social History Social History   Tobacco Use   Smoking status: Never   Smokeless tobacco: Never  Vaping Use   Vaping Use: Some days  Substance Use Topics   Alcohol use: Yes    Comment: Social   Drug use: Never     Allergies   Latex, Penicillins, Hydrocodone-acetaminophen, Methocarbamol, Methylprednisolone, and  Oxycodone-acetaminophen   Review of Systems Review of Systems Per HPI  Physical Exam Triage Vital Signs ED Triage Vitals [12/18/21 0819]  Enc Vitals Group     BP 115/76     Pulse Rate 84     Resp 16     Temp 98.2 F (36.8 C)     Temp Source Oral     SpO2 97 %     Weight      Height      Head Circumference      Peak Flow      Pain Score 2     Pain Loc      Pain Edu?      Excl. in [place]?    No data found.  Updated Vital Signs BP 115/76 (BP Location: Left Arm)    Pulse 84    Temp 98.2 F (36.8 C) (Oral)    Resp 16    SpO2 97%   Visual Acuity Right Eye Distance: 20/40 Left Eye Distance: 20/70 Bilateral Distance: 20/30  Right Eye Near:   Left Eye Near:    Bilateral Near:     Physical Exam Constitutional:      General: She is not in acute distress.    Appearance: Normal appearance. She is not toxic-appearing or diaphoretic.  HENT:     Head: Normocephalic and atraumatic.  Eyes:     General: Lids are everted, no foreign bodies appreciated. Gaze aligned appropriately.        Right eye: No foreign body, discharge or hordeolum.        Left eye: No foreign body, discharge or hordeolum.     Extraocular Movements: Extraocular movements intact.     Conjunctiva/sclera:     Right eye: Right conjunctiva is injected. No chemosis, exudate or hemorrhage.    Left eye: Left conjunctiva is injected. No chemosis, exudate or hemorrhage.    Pupils: Pupils are equal, round, and reactive to light.     Comments: Diffuse scleral redness with mild upper lid edema.  No drainage noted.  Pulmonary:     Effort: Pulmonary effort is normal.  Neurological:     General: No focal deficit present.     Mental Status: She is alert and oriented to person, place, and time. Mental status is at baseline.  Psychiatric:        Mood and Affect: Mood normal.        Behavior: Behavior normal.        Thought Content: Thought content normal.        Judgment: Judgment normal.     UC Treatments / Results  Labs (all labs ordered are listed, but only abnormal results are displayed) Labs Reviewed - No data to display  EKG   Radiology No results found.  Procedures Procedures (including critical care time)  Medications Ordered in UC Medications - No data to display  Initial Impression / Assessment and Plan / UC Course  I have reviewed the triage vital signs and the nursing notes.  Pertinent labs & imaging results that were available during my care of the patient were reviewed by me and considered in my medical decision making (see chart for details).     It appears that patient may be having an allergic reaction to having eyelashes placed yesterday.  She reports that she is allergic to latex, but is not sure if latex was present in the glue or materials for the eyelashes.  There is concern as patient is having some pretty severe blurry vision in the left eye.  Visual acuity is 20/70 in the eye which is a major difference from 20/40 in the right.  Although, she does not have her glasses.  An appointment was made at 9:30 this morning at [facility] for further evaluation and management as the blurry vision is concerning.  Patient was agreeable to plan and left to go to the eye doctor from urgent care. Final Clinical Impressions(s) / UC Diagnoses   Final diagnoses:  Eye redness  Blurred vision, left eye     Discharge Instructions      Please go to provided address for [facility] for further evaluation and management as soon as you leave urgent care.    ED Prescriptions   None    PDMP not reviewed this encounter.   Teodora Medici, [place] 12/18/21 [phone]

## 2021-12-18 NOTE — ED Triage Notes (Signed)
Had her eyelashes done yesterday. Felt her eyes burning when they were applying the adhesive. When she left her eyes were red. She used visine for red eyes, which only made them burn more. Eyes burned throughout the night, began having clear drainage, removed eyelashes. Both eyes red this morning. Reports minor blurred vision

## 2021-12-18 NOTE — Discharge Instructions (Signed)
Please go to provided address for [facility] for further evaluation and management as soon as you leave urgent care.

## 2022-01-19 ENCOUNTER — Institutional Professional Consult (permissible substitution): Payer: BC Managed Care – PPO | Admitting: Plastic Surgery

## 2022-03-08 ENCOUNTER — Emergency Department (HOSPITAL_COMMUNITY): Payer: BC Managed Care – PPO

## 2022-03-08 ENCOUNTER — Emergency Department (HOSPITAL_COMMUNITY)
Admission: EM | Admit: 2022-03-08 | Discharge: 2022-03-08 | Disposition: A | Discharge status: Home / Self Care | Payer: BC Managed Care – PPO | Attending: Emergency Medicine | Admitting: Emergency Medicine

## 2022-03-08 ENCOUNTER — Other Ambulatory Visit: Payer: Self-pay

## 2022-03-08 DIAGNOSIS — S3992XA Unspecified injury of lower back, initial encounter: Secondary | ICD-10-CM | POA: Diagnosis present

## 2022-03-08 DIAGNOSIS — S39012A Strain of muscle, fascia and tendon of lower back, initial encounter: Secondary | ICD-10-CM | POA: Insufficient documentation

## 2022-03-08 DIAGNOSIS — K5900 Constipation, unspecified: Secondary | ICD-10-CM | POA: Insufficient documentation

## 2022-03-08 DIAGNOSIS — R1031 Right lower quadrant pain: Secondary | ICD-10-CM | POA: Diagnosis not present

## 2022-03-08 DIAGNOSIS — Z91040 Latex allergy status: Secondary | ICD-10-CM | POA: Insufficient documentation

## 2022-03-08 DIAGNOSIS — G8929 Other chronic pain: Secondary | ICD-10-CM | POA: Diagnosis not present

## 2022-03-08 DIAGNOSIS — X500XXA Overexertion from strenuous movement or load, initial encounter: Secondary | ICD-10-CM | POA: Diagnosis not present

## 2022-03-08 LAB — CBC WITH DIFFERENTIAL/PLATELET
Abs Immature Granulocytes: 0 10*3/uL (ref 0.00–0.07)
Basophils Absolute: 0 10*3/uL (ref 0.0–0.1)
Basophils Relative: 0 %
Eosinophils Absolute: 1.2 10*3/uL — ABNORMAL HIGH (ref 0.0–0.5)
Eosinophils Relative: 10 %
HCT: 40.5 % (ref 36.0–46.0)
Hemoglobin: 13.7 g/dL (ref 12.0–15.0)
Lymphocytes Relative: 52 %
Lymphs Abs: 6.4 10*3/uL — ABNORMAL HIGH (ref 0.7–4.0)
MCH: 32.3 pg (ref 26.0–34.0)
MCHC: 33.8 g/dL (ref 30.0–36.0)
MCV: 95.5 fL (ref 80.0–100.0)
Monocytes Absolute: 0.2 10*3/uL (ref 0.1–1.0)
Monocytes Relative: 2 %
Neutro Abs: 4.5 10*3/uL (ref 1.7–7.7)
Neutrophils Relative %: 36 %
Platelets: 350 10*3/uL (ref 150–400)
RBC: 4.24 MIL/uL (ref 3.87–5.11)
RDW: 12.3 % (ref 11.5–15.5)
WBC: 12.4 10*3/uL — ABNORMAL HIGH (ref 4.0–10.5)
nRBC: 0 % (ref 0.0–0.2)
nRBC: 0 /100 WBC

## 2022-03-08 LAB — LIPASE, BLOOD: Lipase: 28 U/L (ref 11–51)

## 2022-03-08 LAB — COMPREHENSIVE METABOLIC PANEL
ALT: 37 U/L (ref 0–44)
AST: 18 U/L (ref 15–41)
Albumin: 3.3 g/dL — ABNORMAL LOW (ref 3.5–5.0)
Alkaline Phosphatase: 75 U/L (ref 38–126)
Anion gap: 6 (ref 5–15)
BUN: 10 mg/dL (ref 6–20)
CO2: 29 mmol/L (ref 22–32)
Calcium: 8.3 mg/dL — ABNORMAL LOW (ref 8.9–10.3)
Chloride: 103 mmol/L (ref 98–111)
Creatinine, Ser: 0.86 mg/dL (ref 0.44–1.00)
GFR, Estimated: >60 mL/min (ref >60)
Glucose, Bld: 87 mg/dL (ref 70–99)
Potassium: 3.5 mmol/L (ref 3.5–5.1)
Sodium: 138 mmol/L (ref 135–145)
Total Bilirubin: 0.3 mg/dL (ref 0.3–1.2)
Total Protein: 6 g/dL — ABNORMAL LOW (ref 6.5–8.1)

## 2022-03-08 MED ORDER — IOHEXOL 300 MG/ML  SOLN
100.0000 mL | Freq: Once | INTRAMUSCULAR | Status: AC | PRN
  Administered 2022-03-08: 100 mL via INTRAVENOUS

## 2022-03-08 MED ORDER — ONDANSETRON HCL 4 MG/2ML IJ SOLN
4.0000 mg | Freq: Once | INTRAMUSCULAR | Status: AC
  Administered 2022-03-08: 4 mg via INTRAVENOUS
  Filled 2022-03-08: qty 2

## 2022-03-08 MED ORDER — MORPHINE SULFATE (PF) 4 MG/ML IV SOLN
4.0000 mg | Freq: Once | INTRAVENOUS | Status: AC
  Administered 2022-03-08: 4 mg via INTRAVENOUS
  Filled 2022-03-08: qty 1

## 2022-03-08 NOTE — ED Provider Notes (Cosign Needed)
[facility] EMERGENCY DEPARTMENT Provider Note   CSN: [id number] Arrival date & time: 03/08/22  1756     History  Chief Complaint  Patient presents with   Abdominal Pain    Rhonda Guerrero is a 46 y.o. female.  46 y.o female with a PMH of Chronic lumbar spine pain followed by Dr. Len Childs presents to the ED with a chief complaint of low back pain that began yesterday.  Patient endorses a sharp stabbing pain to her right lower quadrant, this is exacerbated with any inspiration.  She reports multiple years of back pain, however feels that this is somewhat different.  She has been taking tramadol, Flexeril without any improvement in her symptoms.  She also endorses somewhat constipation, her last bowel movement was today but there was very few stool present. She denies any nausea, vomiting, urinary symptoms.   The history is provided by the patient and medical records.  Back Pain Location:  Lumbar spine Associated symptoms: abdominal pain   Associated symptoms: no chest pain, no fever and no headaches   b     Home Medications Prior to Admission medications   Medication Sig Start Date End Date Taking? Authorizing Provider  acetaminophen (TYLENOL) 325 MG tablet Take by mouth.    [provider]  albuterol (VENTOLIN HFA) 108 (90 Base) MCG/ACT inhaler Inhale 2 puffs into the lungs 4 (four) times daily as needed. 02/27/10   [provider]  ascorbic acid (VITAMIN C) 1000 MG tablet Take by mouth.    [provider]  benzonatate (TESSALON) 100 MG capsule Take 1 capsule (100 mg total) by mouth every 8 (eight) hours as needed for cough. 11/02/21   Teodora Medici, FNP  buPROPion (WELLBUTRIN XL) 150 MG 24 hr tablet Take 1 tablet by mouth daily. 10/01/14   [provider]  cyclobenzaprine (FLEXERIL) 10 MG tablet Take 1 tablet by mouth at bedtime. 10/12/19   [provider]  escitalopram (LEXAPRO) 20 MG tablet Take 1 tablet by mouth  daily. 10/01/14   [provider]  fluconazole (DIFLUCAN) 150 MG tablet Take 1 tablet (150 mg total) by mouth daily. 11/02/21   Teodora Medici, FNP  Lactobacillus Rhamnosus, GG, (RA PROBIOTIC DIGESTIVE CARE) CAPS Take 1 capsule by mouth daily.    [provider]  Multiple Vitamins-Minerals (MULTIVITAL PO) Take by mouth.    [provider]  omeprazole (PRILOSEC) 40 MG capsule OPEN 1 CAPSULE AND SPRINKLE INTO APPLESAUCE EVERY MORNING. 10/27/21   Esterwood, Amy S, PA-C  traMADol (ULTRAM) 50 MG tablet Take 50 mg by mouth 2 (two) times daily as needed. 09/15/21   [provider]      Allergies    Latex, Penicillins, Hydrocodone-acetaminophen, Methocarbamol, Methylprednisolone, and Oxycodone-acetaminophen    Review of Systems   Review of Systems  Constitutional:  Negative for chills and fever.  Respiratory:  Negative for shortness of breath.   Cardiovascular:  Negative for chest pain.  Gastrointestinal:  Positive for abdominal pain. Negative for blood in stool, nausea and vomiting.  Genitourinary:  Negative for flank pain.  Musculoskeletal:  Positive for back pain.  Neurological:  Negative for light-headedness and headaches.  All other systems reviewed and are negative.  Physical Exam Updated Vital Signs BP 103/63 (BP Location: Left Arm)    Pulse 77    Temp 98.9 F (37.2 C) (Oral)    Resp 16    SpO2 100%  Physical Exam Vitals and nursing note reviewed.  Constitutional:  Appearance: Normal appearance.  HENT:     Head: Normocephalic and atraumatic.     Nose: Nose normal.  Eyes:     Pupils: Pupils are equal, round, and reactive to light.  Cardiovascular:     Rate and Rhythm: Normal rate.  Pulmonary:     Effort: Pulmonary effort is normal.  Abdominal:     Palpations: Abdomen is soft.     Tenderness: There is abdominal tenderness. There is rebound. There is no right CVA tenderness, left CVA tenderness or guarding.  Musculoskeletal:     Cervical  back: Normal range of motion and neck supple.  Skin:    General: Skin is warm and dry.  Neurological:     Mental Status: She is alert and oriented to person, place, and time.    ED Results / Procedures / Treatments   Labs (all labs ordered are listed, but only abnormal results are displayed) Labs Reviewed  CBC WITH DIFFERENTIAL/PLATELET - Abnormal; Notable for the following components:      Result Value   WBC 12.4 (*)    Lymphs Abs 6.4 (*)    Eosinophils Absolute 1.2 (*)    All other components within normal limits  COMPREHENSIVE METABOLIC PANEL - Abnormal; Notable for the following components:   Calcium 8.3 (*)    Total Protein 6.0 (*)    Albumin 3.3 (*)    All other components within normal limits  LIPASE, BLOOD    EKG None  Radiology DG Lumbar Spine Complete  Result Date: 03/08/2022 CLINICAL DATA:  back pain EXAM: LUMBAR SPINE - COMPLETE 4+ VIEW COMPARISON:  X-ray lumbar spine 03/07/2021, CT abdomen pelvis 03/21/2021 FINDINGS: Limited evaluation due to overlapping osseous structures and overlying soft tissues. Five non-rib-bearing lumbar vertebral bodies. Redemonstration of L4 through S1 posterolateral fusion and L5-S1 interbody fusion. No radiographic findings suggest surgical hardware complication. Multilevel mild to moderate degenerative changes of the spine with bulky osteophyte formation, facet arthropathy, intervertebral disc space vacuum non. There is no evidence of lumbar spine fracture. Alignment is normal. Neural stimulator noted with leads overlying the posterior central canal at the T9-T11 levels. : 1. Negative for acute traumatic injury in a patient status post L4 - S1 posterolateral fusion L5-S1 interbody fusion surgical hardware. 2. Limited evaluation due to overlapping osseous structures and overlying soft tissues. Electronically Signed   By: Iven Finn M.D.   On: 03/08/2022 18:57   CT ABDOMEN PELVIS W CONTRAST  Result Date: 03/08/2022 CLINICAL DATA:  Right  lower quadrant abdominal pain EXAM: CT ABDOMEN AND PELVIS WITH CONTRAST TECHNIQUE: Multidetector CT imaging of the abdomen and pelvis was performed using the standard protocol following bolus administration of intravenous contrast. RADIATION DOSE REDUCTION: This exam was performed according to the departmental dose-optimization program which includes automated exposure control, adjustment of the mA and/or kV according to patient size and/or use of iterative reconstruction technique. CONTRAST:  164m OMNIPAQUE IOHEXOL 300 MG/ML  SOLN COMPARISON:  03/21/2021 FINDINGS: Lower chest: Mild dependent changes in the lung bases. Small esophageal hiatal hernia. Hepatobiliary: No focal liver abnormality is seen. No gallstones, gallbladder wall thickening, or biliary dilatation. Pancreas: Unremarkable. No pancreatic ductal dilatation or surrounding inflammatory changes. Spleen: Normal in size without focal abnormality. Adrenals/Urinary Tract: Adrenal glands are unremarkable. Kidneys are normal, without renal calculi, focal lesion, or hydronephrosis. Bladder is unremarkable. Stomach/Bowel: Postoperative changes consistent with gastric sleeve. Stomach, small bowel, and colon are not abnormally distended. Scattered stool in the colon. No wall thickening or inflammatory changes appreciated. The  appendix is normal. Vascular/Lymphatic: No significant vascular findings are present. No enlarged abdominal or pelvic lymph nodes. Reproductive: Status post hysterectomy. No adnexal masses. Other: No free air or free fluid in the abdomen. Abdominal wall musculature appears intact. Musculoskeletal: Generator pack over the right low back with lead tips extending into the thoracic spine. Postoperative laminectomies with posterior fixation of the lower lumbar spine. Degenerative changes in the spine and hips. IMPRESSION: 1. No acute process demonstrated in the abdomen or pelvis. No evidence of bowel obstruction or inflammation. Appendix is  normal. 2. Small esophageal hiatal hernia. 3. Postoperative gastric sleeve and hysterectomy. 4. Degenerative changes and postoperative changes in the lumbar spine. Electronically Signed   By: Lucienne Capers M.D.   On: 03/08/2022 21:01   CT L-SPINE NO CHARGE  Result Date: 03/08/2022 CLINICAL DATA:  Chronic back pain.  Right lower quadrant pain. EXAM: CT LUMBAR SPINE WITHOUT CONTRAST TECHNIQUE: Multidetector CT imaging of the lumbar spine was performed without intravenous contrast administration. Multiplanar CT image reconstructions were also generated. RADIATION DOSE REDUCTION: This exam was performed according to the departmental dose-optimization program which includes automated exposure control, adjustment of the mA and/or kV according to patient size and/or use of iterative reconstruction technique. COMPARISON:  Lumbar spine radiographs 03/08/2022. CT 03/21/2021. MRI 03/20/2021 FINDINGS: Segmentation: 5 lumbar type vertebral bodies. Alignment: Normal alignment. Vertebrae: No vertebral compression deformities. No focal bone lesion or bone destruction. Bone cortex appears intact. Postoperative laminectomies at L4 and L5 with posterior rod and screw fixation from L4 through the sacrum. Surgical hardware appears intact and bone fusion appears intact. Paraspinal and other soft tissues: No abnormal paraspinal soft tissue mass or infiltration. Paraspinal musculature is symmetrical. Generator pack in the right posterior subcutaneous tissues with lead tips extending into the spine, incompletely visualized. Disc levels: Degenerative changes with narrowed disc spaces and endplate osteophyte formation throughout. 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 Intervertebral disc prosthesis at L5-S1 and L4-5. IMPRESSION: 1. Normal alignment. Diffuse degenerative change. No acute displaced fractures. 2. Postoperative laminectomy with interbody fusion and posterior fixation from L4 through the sacrum. Electronically Signed   By:  Lucienne Capers M.D.   On: 03/08/2022 21:29    Procedures Procedures    Medications Ordered in ED Medications  morphine (PF) 4 MG/ML injection 4 mg (4 mg Intravenous Given 03/08/22 1936)  ondansetron (ZOFRAN) injection 4 mg (4 mg Intravenous Given 03/08/22 1937)  iohexol (OMNIPAQUE) 300 MG/ML solution 100 mL (100 mLs Intravenous Contrast Given 03/08/22 2053)    ED Course/ Medical Decision Making/ A&P                           Medical Decision Making Amount and/or Complexity of Data Reviewed Labs: ordered. Radiology: ordered.  Risk Prescription drug management.   This patient presents to the ED for concern of low back pain/right lower abdominal pain, this involves a number of treatment options, and is a complaint that carries with it a high risk of complications and morbidity.  The differential diagnosis includes appendicitis, lumbar strain, chronic back pain.   Co morbidities: Discussed in HPI   Brief History:  Patient with a longstanding history of lumbar pain, currently seen by Dr. Augustin Coupe in orthopedics for bilateral hip pain, also under the care of her PCP.  Taking Flexeril, tramadol for pain control without much improvement.  Reports a sudden onset of right lower quadrant sharp pain that has been ongoing for the past day.  No relief despite  medication taken at home.  EMR reviewed including pt PMHx, past surgical history and past visits to ER.   See HPI for more details   Lab Tests:  I ordered and independently interpreted labs.  The pertinent results include:    Labs remarkable for a CBC with a slight leukocytosis of 12.4, hemoglobin is within normal limits.  Lipase levels normal.  CMP without any electrolyte derangement, current levels normal.  LFTs are unremarkable.   Imaging Studies:  NAD. I personally reviewed all imaging studies and no acute abnormality found. I agree with radiology interpretation.  Limited evaluation due to overlapping osseous structures and   overlying soft tissues.     Five non-rib-bearing lumbar vertebral bodies.     Redemonstration of L4 through S1 posterolateral fusion and L5-S1  interbody fusion. No radiographic findings suggest surgical hardware  complication. Multilevel mild to moderate degenerative changes of  the spine with bulky osteophyte formation, facet arthropathy,  intervertebral disc space vacuum non. There is no evidence of lumbar  spine fracture. Alignment is normal.     Neural stimulator noted with leads overlying the posterior central  canal at the T9-T11 levels.     :  1. Negative for acute traumatic injury in a patient status post L4 -  S1 posterolateral fusion L5-S1 interbody fusion surgical hardware.  2. Limited evaluation due to overlapping osseous structures and  overlying soft tissues.      1. No acute process demonstrated in the abdomen or pelvis. No  evidence of bowel obstruction or inflammation. Appendix is normal.  2. Small esophageal hiatal hernia.  3. Postoperative gastric sleeve and hysterectomy.  4. Degenerative changes and postoperative changes in the lumbar  spine.      After reviewing these results with patient, I did have CT imaging performed a L-spine review.  Details as follow.  1. Normal alignment. Diffuse degenerative change. No acute displaced  fractures.  2. Postoperative laminectomy with interbody fusion and posterior  fixation from L4 through the sacrum.          Medicines ordered:  I ordered medication including morphine, Zofran for pain Reevaluation of the patient after these medicines showed that the patient improved I have reviewed the patients home medicines and have made adjustments as needed Reevaluation:  After the interventions noted above I re-evaluated patient and found that they have :improved   Social Determinants of Health:  The patient's social determinants of health were a factor in the care of this patient    Problem List / ED  Course:  Patient here with ongoing chronic back pain, reporting some right lower quadrant abdominal pain.  No nausea, no vomiting, afebrile.  Labs are unremarkable.  CT imaging negative for any appendicitis, negative for any pathology to her lumbar spine.  Attributing symptoms to likely due to on chronic back pain.  She does report lifting one of her mother suitcases the other day and pain likely began after.  We did discuss follow-up with her orthopedist along with her pain management doctor.  She is agreeable to plan and treatment.   Dispostion:  After consideration of the diagnostic results and the patients response to treatment, I feel that the patent would benefit from following up with outpatient care team.  Return to the ED if symptoms do worsen.    Portions of this note were generated with [employer]. Dictation errors may occur despite best attempts at proofreading.   Final Clinical Impression(s) / ED Diagnoses Final diagnoses:  Strain  of lumbar region, initial encounter    Rx / DC Orders ED Discharge Orders     None         Janeece Fitting, [place] 03/08/22 2208

## 2022-03-08 NOTE — Discharge Instructions (Addendum)
Your laboratory results were within normal limits today.  You were provided with a copy of your CT imaging. ? ?Please schedule an appointment with your orthopedist in order to obtain further management of your ongoing back pain. ?

## 2022-03-08 NOTE — ED Triage Notes (Signed)
Pt here for low back pain that started yesterday. Pt states she has a hx of back problems and has a spinal cord stimulator. Pt denies injury/trauma, but says pain is constant and non-radiating. Pt has taken tramadol and flexeril without relief ?

## 2022-05-22 ENCOUNTER — Ambulatory Visit: Payer: BC Managed Care – PPO

## 2023-01-08 ENCOUNTER — Ambulatory Visit (INDEPENDENT_AMBULATORY_CARE_PROVIDER_SITE_OTHER): Payer: BC Managed Care – PPO

## 2023-01-08 ENCOUNTER — Ambulatory Visit
Admission: EM | Admit: 2023-01-08 | Discharge: 2023-01-08 | Disposition: A | Discharge status: Home / Self Care | Payer: BC Managed Care – PPO | Attending: Urgent Care | Admitting: Urgent Care

## 2023-01-08 DIAGNOSIS — J209 Acute bronchitis, unspecified: Secondary | ICD-10-CM

## 2023-01-08 DIAGNOSIS — R053 Chronic cough: Secondary | ICD-10-CM

## 2023-01-08 DIAGNOSIS — J189 Pneumonia, unspecified organism: Secondary | ICD-10-CM

## 2023-01-08 DIAGNOSIS — R059 Cough, unspecified: Secondary | ICD-10-CM | POA: Diagnosis not present

## 2023-01-08 MED ORDER — PREDNISONE 20 MG PO TABS
40.0000 mg | ORAL_TABLET | Freq: Every day | ORAL | 0 refills | Status: DC
Start: 2023-01-08 — End: 2024-09-06

## 2023-01-08 MED ORDER — PROMETHAZINE-DM 6.25-15 MG/5ML PO SYRP: 5.0000 mL | ORAL_SOLUTION | Freq: Three times a day (TID) | ORAL | 0 refills | Status: DC | PRN

## 2023-01-08 MED ORDER — FAMOTIDINE 20 MG PO TABS: 20.0000 mg | ORAL_TABLET | Freq: Two times a day (BID) | ORAL | 0 refills | Status: AC

## 2023-01-08 NOTE — ED Provider Notes (Addendum)
[facility]  Note:  This document was prepared using Dragon voice recognition software and may include unintentional dictation errors.  MRN: [id number] DOB: 1976-10-11  Subjective:   Rhonda Guerrero is a 47 y.o. female presenting for 2-3 week history of persistent coughing.  Had COVID 19 in September 2023.  He was recently seen and started on doxycycline at her primary care provider's office.  She is still taking this medication.  Notes that there is no improvement.  Continues to have coughing, congestion, shortness of breath, sinus headaches, wheezing.  Has malaise and fatigue and feels like she is having a breakout of an itchy rash over the mouth and eyes.  No history of eczema.  She does have a history of asthma and generally is aggravated by illness.  No chest x-ray was done through her primary care provider side.  She was advised that she had left upper lobe pneumonia and was treated empirically.  No current facility-administered medications for this encounter.  Current Outpatient Medications:    acetaminophen (TYLENOL) 325 MG tablet, Take by mouth., Disp: , Rfl:    albuterol (VENTOLIN HFA) 108 (90 Base) MCG/ACT inhaler, Inhale 2 puffs into the lungs 4 (four) times daily as needed., Disp: , Rfl:    ascorbic acid (VITAMIN C) 1000 MG tablet, Take by mouth., Disp: , Rfl:    benzonatate (TESSALON) 100 MG capsule, Take 1 capsule (100 mg total) by mouth every 8 (eight) hours as needed for cough., Disp: 21 capsule, Rfl: 0   buPROPion (WELLBUTRIN XL) 150 MG 24 hr tablet, Take 1 tablet by mouth daily., Disp: , Rfl:    cyclobenzaprine (FLEXERIL) 10 MG tablet, Take 1 tablet by mouth at bedtime., Disp: , Rfl:    escitalopram (LEXAPRO) 20 MG tablet, Take 1 tablet by mouth daily., Disp: , Rfl:    fluconazole (DIFLUCAN) 150 MG tablet, Take 1 tablet (150 mg total) by mouth daily., Disp: 1 tablet, Rfl: 0   Lactobacillus Rhamnosus, GG, (RA PROBIOTIC DIGESTIVE CARE) CAPS, Take 1 capsule by  mouth daily., Disp: , Rfl:    Multiple Vitamins-Minerals (MULTIVITAL PO), Take by mouth., Disp: , Rfl:    omeprazole (PRILOSEC) 40 MG capsule, OPEN 1 CAPSULE AND SPRINKLE INTO APPLESAUCE EVERY MORNING., Disp: 90 capsule, Rfl: 2   traMADol (ULTRAM) 50 MG tablet, Take 50 mg by mouth 2 (two) times daily as needed., Disp: , Rfl:    Allergies  Allergen Reactions   Latex Shortness Of Breath, Itching, Swelling and Rash    PATIENT STATES "ONLY ALLERGIC TO THE POWDER INSIDE LATEX GLOVES"    Penicillins Anaphylaxis, Shortness Of Breath, Swelling and Rash    THROAT SWELLING    Hydrocodone-Acetaminophen Swelling    Other reaction(s): Other (See Comments), other/intolerance HEART RACING     Methocarbamol Itching and Rash   Methylprednisolone Hives, Itching and Rash   Oxycodone-Acetaminophen Itching, Rash and Hives    Past Medical History:  Diagnosis Date   Asthma    Blood transfusion without reported diagnosis    With bone surgery in 2010   GERD (gastroesophageal reflux disease)    Hypercholesterolemia    Insomnia    Mixed anxiety and depressive disorder      Past Surgical History:  Procedure Laterality Date   ABDOMINAL HYSTERECTOMY     BARIATRIC SURGERY     Gastric sleeve and duodenal switch   CESAREAN SECTION     SPINE SURGERY     TUBAL LIGATION     WISDOM TOOTH EXTRACTION  Family History  Problem Relation Age of Onset   Colon polyps Mother    Asthma Mother    Hypertension Mother    Hypercholesterolemia Mother    Esophageal cancer Father    Other Father        Malignant Tumor of Lung   Cancer - Other Father        Pharangeal carcinoma   Colon cancer Neg Hx    Rectal cancer Neg Hx    Stomach cancer Neg Hx     Social History   Tobacco Use   Smoking status: Never   Smokeless tobacco: Never  Vaping Use   Vaping Use: Some days  Substance Use Topics   Alcohol use: Yes    Comment: Social   Drug use: Never    ROS   Objective:   Vitals: BP 120/82    Pulse (!) 106   Temp 98.1 F (36.7 C)   Resp 19   SpO2 98%   Physical Exam Constitutional:      General: She is not in acute distress.    Appearance: Normal appearance. She is well-developed. She is not ill-appearing, toxic-appearing or diaphoretic.  HENT:     Head: Normocephalic and atraumatic.     Nose: Congestion present. No rhinorrhea.     Mouth/Throat:     Mouth: Mucous membranes are moist.     Pharynx: No oropharyngeal exudate or posterior oropharyngeal erythema.  Eyes:     General: No scleral icterus.       Right eye: No discharge.        Left eye: No discharge.     Extraocular Movements: Extraocular movements intact.  Cardiovascular:     Rate and Rhythm: Normal rate and regular rhythm.     Heart sounds: Normal heart sounds. No murmur heard.    No friction rub. No gallop.  Pulmonary:     Effort: Pulmonary effort is normal. No respiratory distress.     Breath sounds: No stridor. No wheezing, rhonchi or rales.     Comments: Decreased lung sounds throughout. Chest:     Chest wall: No tenderness.  Skin:    General: Skin is warm and dry.  Neurological:     General: No focal deficit present.     Mental Status: She is alert and oriented to person, place, and time.  Psychiatric:        Mood and Affect: Mood normal.        Behavior: Behavior normal.     Assessment and Plan :   PDMP not reviewed this encounter.  1. Acute bronchitis, unspecified organism   2. Persistent cough   3. Pneumonia of left upper lobe due to infectious organism     Patient denies history of diabetes.  As such, I recommended using an oral prednisone course in the context of her asthma and for management of her bronchitis.  Recommended she restart her albuterol inhaler.  X-ray over-read was pending at time of discharge, recommended follow up with only abnormal results. Otherwise will not call for negative over-read. Patient was in agreement. Will update treatment plan thereafter. Counseled patient  on potential for adverse effects with medications prescribed/recommended today, ER and return-to-clinic precautions discussed, patient verbalized understanding.    Jaynee Eagles, PA-C 01/08/23 [id number]  UPDATE: DG Chest 2 View  Result Date: 01/08/2023 CLINICAL DATA:  Persistent cough. EXAM: CHEST - 2 VIEW COMPARISON:  None Available. FINDINGS: Thoracic spinal cord stimulator. ACDF. No consolidation. No visible pleural effusions or pneumothorax. Cardiomediastinal  silhouette is within normal limits. IMPRESSION: No active cardiopulmonary disease. Electronically Signed   By: Margaretha Sheffield M.D.   On: 01/08/2023 09:29    No change in treatment plan.  I did recommend the patient start her omeprazole and coupled this with famotidine so that we could use the prednisone for her bronchitis and respiratory symptoms in the context of her asthma.  Otherwise chest x-ray negative for pneumonia and will hold off on antibiotics.   Jaynee Eagles, [place] 01/08/23 [phone]

## 2023-01-08 NOTE — Discharge Instructions (Addendum)
Restart omeprazole, use famotidine for the next 2 weeks while we use prednisone. I will update your diagnoses when we get the x-ray results.

## 2023-01-08 NOTE — ED Triage Notes (Addendum)
[facility] presents [facility] with [facility] of symptoms worsening  Recent diagnosis on Monday of pneumonia and cough with antibiotic doxycycline. Pt reports chills and hot flashes, thick phlegm, fevers, congestion, ha, sob, rash on face, droping things, fatigue.

## 2023-01-08 NOTE — ED Triage Notes (Signed)
Formatting of this note might be different from the original.  Pt presents to [facility] with [facility] of symptoms worsening   Recent diagnosis on Monday of pneumonia and cough with antibiotic doxycycline. Pt reports chills and hot flashes, thick phlegm, fevers, congestion, ha, sob, rash on face, droping things, fatigue.   Electronically signed by Thedora Hinders, RN at 01/08/2023  8:51 AM EST

## 2023-01-08 NOTE — ED Provider Notes (Signed)
Formatting of this note is different from the original.  Images from the original note were not included.    [facility]    Note:  This document was prepared using Dragon voice recognition software and may include unintentional dictation errors.    MRN: [id number] DOB: 1976-05-09    Subjective:     Jillian Harvey is a 47 y.o. female presenting for 2-3 week history of persistent coughing.  Had COVID 19 in September 2023.  He was recently seen and started on doxycycline at her primary care provider's office.  She is still taking this medication.  Notes that there is no improvement.  Continues to have coughing, congestion, shortness of breath, sinus headaches, wheezing.  Has malaise and fatigue and feels like she is having a breakout of an itchy rash over the mouth and eyes.  No history of eczema.  She does have a history of asthma and generally is aggravated by illness.  No chest x-ray was done through her primary care provider side.  She was advised that she had left upper lobe pneumonia and was treated empirically.    No current facility-administered medications for this encounter.    Current Outpatient Medications:     acetaminophen (TYLENOL) 325 MG tablet, Take by mouth., Disp: , Rfl:     albuterol (VENTOLIN HFA) 108 (90 Base) MCG/ACT inhaler, Inhale 2 puffs into the lungs 4 (four) times daily as needed., Disp: , Rfl:     ascorbic acid (VITAMIN C) 1000 MG tablet, Take by mouth., Disp: , Rfl:     benzonatate (TESSALON) 100 MG capsule, Take 1 capsule (100 mg total) by mouth every 8 (eight) hours as needed for cough., Disp: 21 capsule, Rfl: 0    buPROPion (WELLBUTRIN XL) 150 MG 24 hr tablet, Take 1 tablet by mouth daily., Disp: , Rfl:     cyclobenzaprine (FLEXERIL) 10 MG tablet, Take 1 tablet by mouth at bedtime., Disp: , Rfl:     escitalopram (LEXAPRO) 20 MG tablet, Take 1 tablet by mouth daily., Disp: , Rfl:     fluconazole (DIFLUCAN) 150 MG tablet, Take 1 tablet (150 mg total) by mouth daily.,  Disp: 1 tablet, Rfl: 0    Lactobacillus Rhamnosus, GG, (RA PROBIOTIC DIGESTIVE CARE) CAPS, Take 1 capsule by mouth daily., Disp: , Rfl:     Multiple Vitamins-Minerals (MULTIVITAL PO), Take by mouth., Disp: , Rfl:     omeprazole (PRILOSEC) 40 MG capsule, OPEN 1 CAPSULE AND SPRINKLE INTO APPLESAUCE EVERY MORNING., Disp: 90 capsule, Rfl: 2    traMADol (ULTRAM) 50 MG tablet, Take 50 mg by mouth 2 (two) times daily as needed., Disp: , Rfl:      Allergies   Allergen Reactions    Latex Shortness Of Breath, Itching, Swelling and Rash     PATIENT STATES "ONLY ALLERGIC TO THE POWDER INSIDE LATEX GLOVES"     Penicillins Anaphylaxis, Shortness Of Breath, Swelling and Rash     THROAT SWELLING     Hydrocodone-Acetaminophen Swelling     Other reaction(s): Other (See Comments), other/intolerance  HEART RACING       Methocarbamol Itching and Rash    Methylprednisolone Hives, Itching and Rash    Oxycodone-Acetaminophen Itching, Rash and Hives     Past Medical History:   Diagnosis Date    Asthma     Blood transfusion without reported diagnosis     With bone surgery in 2010    GERD (gastroesophageal reflux disease)     Hypercholesterolemia  Insomnia     Mixed anxiety and depressive disorder        Past Surgical History:   Procedure Laterality Date    ABDOMINAL HYSTERECTOMY      BARIATRIC SURGERY      Gastric sleeve and duodenal switch    CESAREAN SECTION      SPINE SURGERY      TUBAL LIGATION      WISDOM TOOTH EXTRACTION       Family History   Problem Relation Age of Onset    Colon polyps Mother     Asthma Mother     Hypertension Mother     Hypercholesterolemia Mother     Esophageal cancer Father     Other Father         Malignant Tumor of Lung    Cancer - Other Father         Pharangeal carcinoma    Colon cancer Neg Hx     Rectal cancer Neg Hx     Stomach cancer Neg Hx      Social History     Tobacco Use    Smoking status: Never    Smokeless tobacco: Never   Vaping Use    Vaping Use: Some days   Substance Use Topics    Alcohol use:  Yes     Comment: Social    Drug use: Never     ROS    Objective:     Vitals:  BP 120/82   Pulse (!) 106   Temp 98.1 F (36.7 C)   Resp 19   SpO2 98%     Physical Exam  Constitutional:       General: She is not in acute distress.     Appearance: Normal appearance. She is well-developed. She is not ill-appearing, toxic-appearing or diaphoretic.   HENT:      Head: Normocephalic and atraumatic.      Nose: Congestion present. No rhinorrhea.      Mouth/Throat:      Mouth: Mucous membranes are moist.      Pharynx: No oropharyngeal exudate or posterior oropharyngeal erythema.   Eyes:      General: No scleral icterus.        Right eye: No discharge.         Left eye: No discharge.      Extraocular Movements: Extraocular movements intact.   Cardiovascular:      Rate and Rhythm: Normal rate and regular rhythm.      Heart sounds: Normal heart sounds. No murmur heard.     No friction rub. No gallop.   Pulmonary:      Effort: Pulmonary effort is normal. No respiratory distress.      Breath sounds: No stridor. No wheezing, rhonchi or rales.      Comments: Decreased lung sounds throughout.  Chest:      Chest wall: No tenderness.   Skin:     General: Skin is warm and dry.   Neurological:      General: No focal deficit present.      Mental Status: She is alert and oriented to person, place, and time.   Psychiatric:         Mood and Affect: Mood normal.         Behavior: Behavior normal.     Assessment and Plan :     PDMP not reviewed this encounter.    1. Acute bronchitis, unspecified organism    2. Persistent cough  3. Pneumonia of left upper lobe due to infectious organism      Patient denies history of diabetes.  As such, I recommended using an oral prednisone course in the context of her asthma and for management of her bronchitis.  Recommended she restart her albuterol inhaler.  X-ray over-read was pending at time of discharge, recommended follow up with only abnormal results. Otherwise will not call for negative  over-read. Patient was in agreement. Will update treatment plan thereafter. Counseled patient on potential for adverse effects with medications prescribed/recommended today, ER and return-to-clinic precautions discussed, patient verbalized understanding.      Jaynee Eagles, PA-C  01/08/23 [id number]    UPDATE:  DG Chest 2 View    Result Date: 01/08/2023  CLINICAL DATA:  Persistent cough. EXAM: CHEST - 2 VIEW COMPARISON:  None Available. FINDINGS: Thoracic spinal cord stimulator. ACDF. No consolidation. No visible pleural effusions or pneumothorax. Cardiomediastinal silhouette is within normal limits. IMPRESSION: No active cardiopulmonary disease. Electronically Signed   By: Margaretha Sheffield M.D.   On: 01/08/2023 09:29      No change in treatment plan.  I did recommend the patient start her omeprazole and coupled this with famotidine so that we could use the prednisone for her bronchitis and respiratory symptoms in the context of her asthma.  Otherwise chest x-ray negative for pneumonia and will hold off on antibiotics.    Jaynee Eagles, PA-C  01/08/23 [id number]    Electronically signed by Jaynee Eagles, PA-C at 01/08/2023  9:38 AM EST

## 2023-03-18 NOTE — Patient Instructions (Signed)
Formatting of this note is different from the original.  Preop Instructions  Please read carefully    Name: Jillian Harvey  Surgeon: Cletus Gash   MRN:  DOB: [id number]  04-08-1976     Surgery Date: 04/01/23     Preparing to Come   We offer pre-op classes once a week. Some classes are online and some classes are in-person.  Class will begin promptly at 12 noon and are about an hour in length.   For questions and to register, please call [email or web address], They are open Monday - Friday 8 am until 6 pm.    To reduce the chance of infection, please do a HIBICLENS wash before surgery.  If you need to purchase this bottle of HIBICLENS wash, please check your local pharmacy OR stores like [employer] & [employer].  You will need a 4 oz bottle.  Take a shower the evening before your surgery and use  the bottle of HIBICLENS, then use the other  the morning of surgery. Use the HIBICLENS all over your body except on your face and genital area as the HIBICLENS could cause irritation.  Use a mesh/nylon shower sponge that is used with shower gels - not a cotton washcloth.       Do not shave near the surgical site 3 days prior to surgery.    No make-up (cosmetics,) aerosol sprays, perfumes/colognes, or lotions after you shower on the day of surgery.    Roll-on or stick deodorant is acceptable UNLESS you are having a shoulder surgery.    Remove all nail polish, including acrylic and gel nails from hands. If having surgery on legs/feet, remove nail polish from your toes.    Hair pieces, wigs, hair pins must be removed prior to going into the operating room.    Do not drink alcohol or use marijuana 24 hours prior to your surgery.    Avoid smoking or vaping after midnight.    You must arrange for someone to bring you home after your surgery. A cab or bus will not be acceptable. Please arrange for someone to stay with you for several days.    If any feeling of illness comes up (cold, sore throat, nausea, vomiting, or fever) promptly notify your  physician.    If you develop an open area or rash on any part of your body, please call your surgeon immediately.     You will receive a phone call 1 business day before your surgery between 1 - 5 pm to confirm your arrival time the day of surgery.     Food/Liquids     Unless otherwise instructed by your physician or anesthesiologist:  Do not eat any solid food after midnight.  After midnight you may have ONLY clear liquids 4 hours prior to your scheduled surgery time. Clear liquids include water, black coffee or tea (no cream or creamer,) clear carbonated soft drinks.    Medications     Bring a list of all your medications and dosages with you to the hospital.    STOP the following 1 week before surgery:   All anti-inflammatory medications (NSAIDS) including advil, ibuprofen, motrin, aleve, naprosyn, all vitamins and supplements. All aspirin products, unless otherwise directed by your prescriber or surgeon's office. If you are on Potassium or Iron replacements, do not stop these medications when you stop other supplements.  Tylenol is okay to continue to use and may be taken the day before surgery if needed. Do not take the morning of surgery.  CONTINUE all your prescription medications the week before surgery and take the day before surgery, morning and evening as you normally do.     TAKE ONLY the morning dose of the following medications the morning of your surgery:Lyrica, Pepcid, Omeprazole, Lexapro and Cymbalta    Bring your nerve stimulator with you to the hospital       Belongings     All jewelry (rings, earrings, and body piercings) must be removed prior to going into the operating room.  It may be necessary to cut rings off if unable to remove.    Please do not bring valuables with you to the hospital (jewelry and excessive amounts of money.)  You should be prepared to leave these items with whoever accompanies you to the hospital.    If you wear glasses, contact lenses, hearing aids or dentures, they  must be removed prior to going to the operating room.  Please bring a case for your glasses, contacts, hearing aids or dentures with you.    Items to Bring with You     Please bring the following with you when you come to the hospital:   Picture ID, all appropriate insurance cards, and copay. Please note you may have hospital, DME, and medication copays.  C-pap machine if you use one. Distilled water will be available if needed.   If you have an Advanced Directive or Medical Power of Attorney, please bring a copy with you to the hospital to be placed in your medical record.    Your surgery will be in the Main OR at [facility] [place]. Please check in at the Registration desk at the back of the main lobby and they will direct you to the surgical waiting area.    I have reviewed these instructions with the in the clinic. If you have any questions regarding these pre-op instructions, please call me.    Best regards,     Catrelle Martinique  [facility]  Pre Anesthesia Surgical Screening   [phone]  Electronically signed by [place], Catrelle at 03/18/2023  4:54 PM EDT

## 2023-03-18 NOTE — Progress Notes (Signed)
Formatting of this note is different from the original.  [facility]    NAME    Jillian Harvey     LOCATION OF PATIENT:   [place] OF CONSULTATION:   03/18/2023    CONSULTING PROVIDER:   Constance Holster, PA     REFERRING PHYSICIAN:   Idelia Salm, MD     PCP:    Stana Bunting     IMPRESSION/PLAN      No contraindications to proceeding with Left Total Hip Arthroplasty surgery on 04/01/23.  Labs, pertinent radiological studies, and EKG reviewed and patient medically optimized for surgery.       Principal Problem:    Primary osteoarthritis of left hip - Left THA on 04/01/23 with Dr. Cletus Gash   - VTE prophylaxis, pain and post op management per orthopedics.   - Continue Tylenol prn and Tramadol 50mg  prn     Active Problems:    Anxiety and depression   - Continue Lexapro 10mg  daily and Cymbalta 30mg  daily    Asthma   - Continue Albuterol inhaler prn; infrequent use, 1-2x every few months    Lumbar radiculopathy  - S/p Lumbar fusion in 2010 and Spinal Cord stimulator in 2022; advised to bring remote the day of surgery  - Continue Lyrica 75mg  daily and Flexeril 10mg  TID    Cervical radiculopathy   - S/p cervical fusion in 2021    Insomnia   - Continue Trazodone 50mg  nightly prn     GERD (gastroesophageal reflux disease)   - Continue Omeprazole 40mg  daily and Pepcid 20mg  BID    Class 1 obesity in adult   - Would benefit from weight loss regiment    - S/p Biliopancreatic diversion surgery in 2021    -----------------------------------------------------------------   Pre-operative exam for scheduled surgery:     HISTORY OF PRESENT ILLNESS  Jillian Harvey has known osteoarthritis of the left hip. Pt has had pain for quite some time and now interferes with daily activities.  Pt has failed conservative management and desires to proceed with surgery.    Medical notes and testing from the following provider(s) have been reviewed and documented in this note: epic  documentation.    The patient is referred for physical assessment to evaluate peri-operative risks from co-morbid health issues including but not limited to the listed conditions above.    Peri-operative risks impacted by these co-morbidities and current medical problems/status are addressed at this office visit.    Complete instructions to patients including how to take usual and prescribed medications peri-operatively have been given to patient.        INSTRUCTIONS:  Bathe or shower with Hibiclens as per instructions if having total joint procedure.  The patient is instructed to d/c all ASA, anti-inflammatories, and supplements for one week prior to surgery.      The patient is instructed to take the following medications the am of surgery with a sip of water:     Lyrica, Pepcid, Omeprazole, Lexapro and Cymbalta       MEDICAL HISTORY:  Past Medical History:   Diagnosis Date    Asthma     Headache disorder     occ due to cervical fusion    Osteoarthritis      SURGICAL HISTORY:    Past Surgical History:   Procedure Laterality Date    CERVICAL FUSION  12/2019    CESAREAN SECTION      x 2  HYSTERECTOMY  2019    LUMBAR FUSION  2010    OTHER  2021    Biliopancreatic Diversion weightloss surgery    SPINAL CORD [facility]  2022    controlled w/ remote    TUBAL LIGATION           ANESTHESIA HISTORY:   Previous adverse reactions to anesthesia:  None.    Family history of previous adverse reactions to anesthesia:  None.      FAMILY HISTORY  Family History   Problem Relation Age of Onset    Stroke Mother         s/p COVID infection         SOCIAL HISTORY:  Social History     Socioeconomic History    Marital status: Single   Tobacco Use    Smoking status: Never    Smokeless tobacco: Never   Vaping Use    Vaping Use: Some days    Substances: THC   Substance and Sexual Activity    Alcohol use: Yes     Comment: social    Drug use: No     ALLERGIES:  Allergies   Allergen Reactions    Percocet [Oxycodone-Acetaminophen]  rash/itching     "Internal itching"    Vicodin [Hydrocodone-Acetaminophen] anaphylaxis/angioedema     HEART RACING    Latex, Natural Rubber rash/itching and swelling    Nsaids (Non-Steroidal Anti-Inflammatory Drug) gi distress     Due to weight loss surgery     Penicillins swelling       The patient knows of no other allergies to drugs, foods, or latex products.    CURRENT MEDICATIONS:       Home Medication List - Marked as Reviewed on 03/18/23 1450   Medication Sig   albuterol (VENTOLIN HFA) 90 mcg/actuation INH HFAA inhaler Take 2 Puffs inhaled by mouth Every 4 Hours As Needed.   Cholecalciferol, Vitamin D3, (VITAMIN D-3) 50 mcg (2,000 unit) PO TABS Take  by Mouth Once a Day.   cyclobenzaprine (FLEXERIL) 10 mg PO TABS Take 1 Tab by Mouth 3 Times Daily.   diphenhydramine HCl (BENADRYL PO) Take  by Mouth 2 (two) times a day. Take w/ the famotidine to help prevent rash on face   DULoxetine (CYMBALTA) 30 mg PO CPDR Take 1 Cap by Mouth Once a Day.   EScitalopram (LEXAPRO) 10 mg PO TABS Take 1 Tab by Mouth Once a Day.  Patient taking differently: Take 3 Tabs by Mouth Once a Day.   famotidine (PEPCID) 20 mg PO TABS Take 1 Tab by Mouth Twice Daily.   hydrOXYzine (ATARAX) 25 mg PO TABS Take 1 Tab by Mouth Take As Needed.   Lactobac 40/Bifido 3/S.thermop (PROBIOTIC PO) Take  by Mouth.   multivit,calc,mins/iron/folic (ONE-A-DAY WOMENS FORMULA PO) Take  by Mouth Once a Day.   Omeprazole 40 mg PO CPDR Take 1 Cap by Mouth Once a Day.   polyethylene glycol (MIRALAX) 17 gram PO PwPk Take 1 Packet by Mouth Once a Day.   pregabalin (LYRICA) 75 mg PO CAPS Take 1 Cap by Mouth Twice Daily. 1 in the morning/afternoon & 2 at night   traMADoL (ULTRAM) 50 mg PO TABS Take 1 Tab by Mouth Take As Needed.   traZODone (DESYREL) 50 mg PO TABS Take 1 Tab by Mouth Every Night at Bedtime.         REVIEW OF SYSTEMS WITH  PAST HISTORY AND CURRENT SYMPTOM STATUS:       Review of  Systems   Constitutional: Negative for fever and chills.   HENT: Negative  for congestion and sore throat.    Eyes: Negative for blurred vision and double vision.   Respiratory: Negative for cough and shortness of breath.    Cardiovascular: Negative for chest pain and palpitations.   Gastrointestinal: Negative for nausea, vomiting and abdominal pain.   Genitourinary: Negative for dysuria and frequency.   Musculoskeletal: Negative for myalgias. Left hip pain as previously noted.    Skin: Negative for rash and itching.   Neurological: Negative for sensory change, focal weakness and headaches.   Psychiatric/Behavioral: Negative for depression. The patient is not nervous/anxious.        PHYSICAL EXAMINATION:    BP 117/69   Pulse 87   Temp 98.6 F (37 C)   Resp 16   Ht 5\' 4"  (1.626 m)   Wt 88.5 kg (195 lb)   SpO2 98%   BMI 33.47 kg/m   BMI:  Body mass index is 33.47 kg/m.    Physical Exam:  General appearance: patient appears in no acute distress and appears well-developed and overweight.  Skin: warm & dry, no rash, no jaundice  HEENT:   Head: Normocephalic and atraumatic  Eyes: EOMI, Pupils are equal, round and reactive to light.  Neck: supple.  Heart: regular rate and rhythm, no murmurs  Lungs: clear, no wheezes or crackles noted  Abdomen: soft, non-distended and non-tender  Extremities: calves soft and non-tender, no edema of legs, all extremities are warm and appear well perfused.    Neuro: alert, oriented, affect appropriate and no focal neurological deficits.  Psychiatric: normal mood and affect; behavior is normal.  Judgment and thought content normal.    LABORATORY DATA:    Latest Reference Range & Units 03/12/23 15:30   WBC 4.0 - 11.0 K/uL 8.5   RBC 3.80 - 5.20 M/uL 4.05   HGB 11.7 - 16.0 g/dL 12.6   HCT 35.1 - 48.0 % 38.8   MCV 80 - 99 fL 96   MCH 26 - 34 pg 31   MCHC 31 - 36 g/dL 33   RDW 10.0 - 15.5 % 12.2   Platelet 140 - 440 K/uL 362   MPV 9.0 - 13.0 fL 8.9 (L)   Protime 9.0 - 13.0 sec 10.0   INR 0.89 - 1.29  0.92   aPTT 22 - 36 sec 29   Glucose  70 - 99 mg/dL 91   BUN 6  - 22 mg/dL 13   Creatinine 0.5 - 1.2 mg/dL 0.8   eGFR >60.0 mL/min/1.73 sq.m. >60.0   Sodium 133 - 145 mmol/L 139   Potassium 3.5 - 5.5 mmol/L 4.1   Chloride 98 - 110 mmol/L 106   CO2 20 - 32 mmol/L 21   Calcium 8.4 - 10.5 mg/dL 8.4   ANION GAP 3.0 - 15.0 mmol/L 12.0   Urine Color Colorless, Pale Yellow, Light Yellow, Yellow, Dark Yellow, Straw  Yellow   Urine Clarity Clear, Slightly Cloudy  Clear   Urine Specific Gravity 1.005 - 1.030  1.024   Urine pH 5.0 - 8.0 pH 5.5   Urine Protein Screen Negative, Trace mg/dL Negative   Urine Glucose Negative mg/dL Negative   Urine Ketones Negative mg/dL Negative   Urine Bilirubin Negative  Negative   Urine Blood Negative  Negative   Urine Nitrite Negative  Negative   Urine Leukocyte Esterase Negative  Negative   Urine Urobilinogen <2.0 mg/dL mg/dL 1.0   (L): Data  is abnormally low      Radiology/EKG:  Recent Results (from the past 1080 hour(s))   1. X-RAY EXAM HIP UNILAT,2-3 VIEWS,LT    Narrative    EXAM: X-RAY EXAM HIP UNILAT,2-3 VIEWS,LT    CLINICAL INDICATION/HISTORY: [id number]: Pre-op testing  M16.12: Unilateral primary osteoarthritis, left hip  Additional: None    COMPARISON: None    TECHNIQUE:  2 views of the left hip    FINDINGS:    BONES: Normal anatomic alignment. No evidence of acute fracture or dislocation. Mild degenerative changes with joint space narrowing and subchondral sclerosis noted at bilateral hip joints. Lower lumbar spinal fusion.    SOFT TISSUES: Unremarkable.     Impression    1. No acute osseous abnormality.  2. Mild bilateral hip joint osteoarthritis.    Signed By: Astrid Drafts, MD on 03/12/2023 4:25 PM    2. CHEST PA AND LATERAL    Narrative    EXAM: CHEST  PA  AND LATERAL    CLINICAL INDICATION:Z01.818 Pre-op testing.  M16.12 Unilateral primary osteoarthritis, left hip.    COMPARISON: Chest x-ray dated 03/25/2019    TECHNIQUE: 2 views of the chest were obtained.    FINDINGS:    LINES/TUBES/DEVICES: Neurostimulator leads with distal tip in the  midthoracic region.    HEART/MEDIASTINUM: Cardiomediastinal silhouette is unremarkable.    LUNGS/PLEURA: No focal consolidation, pleural effusion , or pneumothorax.    BONY STRUCTURES/SOFT TISSUES: Lower cervical fusion hardware No acute findings.     Impression    1. No acute cardiopulmonary findings.    Signed By: Astrid Drafts, MD on 03/12/2023 4:23 PM    3. EKG 12-LEAD   Result Value Ref Range    Heart Rate 82 bpm    RR Interval 732 ms    Atrial Rate 82 ms    P-R Interval 126 ms    P Duration 111 ms    P Horizontal Axis -12 deg    P Front Axis 54 deg    Q Onset 499 ms    QRSD Interval 70 ms    QT Interval 362 ms    QTcB 423 ms    QTcF 402 ms    QRS Horizontal Axis -14 deg    QRS Axis 69 deg    I-40 Front Axis 38 deg    t-40 Horizontal Axis -20 deg    T-40 Front Axis 95 deg    T Horizontal Axis -32 deg    T Wave Axis 17 deg    S-T Horizontal Axis -6 deg    S-T Front Axis 26 deg    Impression - BORDERLINE ECG -     Impression SR-Sinus rhythm-normal P axis, V-rate 50-99     Impression       LVOLP-Low voltage, precordial leads-precordial leads <1.68mV     Constance Holster, PA     03/18/2023, 2:50 PM  Electronically signed by Racheal Patches, MD at 03/19/2023  9:59 AM EDT    Associated attestation - Racheal Patches, MD - 03/19/2023  9:59 AM EDT  Formatting of this note is different from the original.  Images from the original note were not included.      [place]   I personally reviewed history and management plan.    I agree with the findings as documented   Discuss with the PA.     Patient Vitals for the past 24 hrs:   BP Temp Pulse Resp SpO2 Height  Weight   03/18/23 1545 117/69 98.6 F (37 C) 87 16 98 % 64" (1.626 m) 88.5 kg (195 lb)     Past Medical History:   Diagnosis Date    Asthma     Headache disorder     occ due to cervical fusion    Osteoarthritis      [place] Ouch.  [facility]   03/19/2023, 9:59 AM

## 2023-04-01 NOTE — Unmapped (Signed)
Brief Post-Op Note    Surgery Date: 04/01/2023  Case Tracking Events       Event Time In    Procedure Start 04/01/2023 9147    Procedure End                 Procedure  Primary Surgeon    ARTHROPLASTY HIP REPLACEMENT (Left)  Claiborne Billings, MD    * Panel 2 does not exist *  * Panel 2 does not exist *    * Panel 3 does not exist *  * Panel 3 does not exist *     Surgeons and Role:     Claiborne Billings, MD - Primary    Other OR Staff/Assistants:  Surgical Assistant: Kandee Keen    1st Assistant Tasks:  Closing    Pre-operative Diagnosis:  degenerative joint disease of left hip    Post-operative Diagnosis: same as preop diagnosis    Procedure: Left total hip replacement with a 50 osseoti cup, apex hole eliminator; liner 36 x 50 neutral; stem - 7 high echo-micro reduced proximal profile.  Ceramic head 36 with -6 adapter    Anesthesia Type: General Anesthesia     Complications: No    EBL: 200cc    Blood Administered: No    Drains: none    Specimens: No specimens    Implant Name Type Inv. Item Serial No. Manufacturer Lot No. LRB No. Used Action   SHELL ACETABULAR G7 LIMITED HOLE OSSEOTI SIZE D - [id number] Implantables SHELL ACETABULAR G7 LIMITED HOLE OSSEOTI SIZE D  [employer] [id number] Left 1 Implanted   LINER ACETABULAR G7 D VIVACIT-E HIP STRL LUMEN LF - [id number] Implantables LINER ACETABULAR G7 D VIVACIT-E HIP STRL LUMEN LF  [employer]THO [id number] Left 1 Implanted   STEM FEMORAL  REDUCE PROXIMAL PROFILE ECHO BI-METRIC MICROPLASTY PPS 130 - [id number] Implantables STEM FEMORAL  REDUCE PROXIMAL PROFILE ECHO BI-METRIC MICROPLASTY PPS 130 N/A [employer]ET [id number] Left 1 Implanted   HEAD OPTION  DELTA BIOLOX CERAMIC - [id number] Implantables HEAD OPTION  DELTA BIOLOX CERAMIC N/A Beryle Flock [id number] Left 1 Implanted      Plan:  WBAT on LLE  Total hip precautions  Home PT  Return to office on POD 13 for staple removal  Heparin tonight  and home on ELiquis for DVT prophylaxis    Claiborne Billings, MD  Dictated:[id number]

## 2023-04-03 ENCOUNTER — Encounter: Admit: 2023-04-03 | Discharge: 2023-04-03 | Payer: BLUE CROSS/BLUE SHIELD | Primary: Case Management

## 2023-04-03 NOTE — Home Health (Signed)
ADMISSION [facility]AS[facility]  Admission Summary: Ms Jillian Harvey is a 47 year old female being admitted to home health for PT services.  Pt was see for Outpt surgery on 04/01/23 and released 04/02/23 for THR by Dr. Broadus [name].   The patient's caregiver/representative present, & able and willing to provide care daily (mother).    Patient participated in goal setting and care planning and are agreeable to the care plan. Discharge planning/training was initiated for independence and Outpt. PT as recommended by Dr. Broadus [name].   Admission booklet reviewed with patient; including review of rights and responsibilities, Youth worker, and contact information for administrator, CM, Medicare, QIO, and outside resources for independence.  PMHx: Asthma   Headache disorder   occ due to cervical fusion   Osteoarthritis   5 back surgeries per pt and will need another one in the future    SUBJECTIVE: "I am in so much pain."   Pt reports 7/10 pain today and increased to 10/10 in the past 24 hours.  Pt denies falls.     REQUIRES CAREGIVER ASSISTANCE FOR:  mother and son both can assist with meals, ADLs, TED hose, transportation, medications, ambulation, exercises   PLOF:Pt lives with mom and son (43 yo)  in 1 level apt with no stairs to enter but has curb from parking.  Pt ambulated with no device. Pt was indep with ADLs.  Pt would order groceries to home,  Pt was driving, manage medications, family shares home tasks.       DME: FWW,  commode, bath chair.  Pt ordered a hip kit and shoe lift  MEDICATIONS REVIEWED AND UPDATED: Medications reconciled and all medications are available in the home this visit.  The following education was provided regarding high risk medications (Eliquis as blood thinner - educated on bleeding and fall precautions, hydromorphone - educated to monitor BMs, as well as to take as directed), medication interactions, and look a like medications: Continue medication as prescribed by MD. Medications are effective at this  time.  NEXT MD APPT:  with Dr. Broadus [name] on 04/14/23  ROM:  Limited L hip ROM due to pain  STRENGTH:  see strength section for details.   WOUNDS: L lateral hip incision site below Aquacell dressing.  Dressing change to be done in Dr. Broadus [name] office at f/u appt.  Extra dressings left in home just in case dressing change for drainage needs to be done.  Call Dr. Broadus [name] office prior to any dressing change if needed.   BED MOBILITY: sit <> sup  mod A for LLE and 100% instructions on how to perform for greater ease and pain management.  Use of small stool to help get in and out of bed.      TRANSFERS: sit to stand from bed with SBA with cues for proper hand placement for safety and proper technique.   GAIT: pt ambulated 43' with FWW and SBA with cues for proper heel to toe gait pattern on LLE and step through gait pattern.   Pt at this time performing step to gait pattern and PT noted that pt has significant leg length discrepancy with LLE>LRLE. Pt advised to use shoe on R side and gripper sock on LLE side.  PT also did recommend purchasing a shoe lift to place in L shoe to help with leveling out pt for ambulation for prevention of increased pain or further complications.  Pt also advised to notify MD of the leg length discrepancy at f/u appt or if pain persists prior  other than normal surgical pain.   STAIRS: NA  BALANCE:  fair balance with FWW.  No LOB noted with gait but unsteady fair - gait without device   PATIENT EDUCATION PROVIDED THIS VISIT: safety for transfers and hand placement, HEP per handout, walking with heel to toe gait pattern, and step through gait pattern, deep breathing/PLB, incentive spirometer 10x/hr while awake, ice pack with barrier between L hip and skin for 20 min on and 40 min off daily as needed, signs and symptoms of infection, clearing obstacles and clear pathways to prevent falls as well as proper lighting at night time, THR precautions, proper technique for bed mobility.   HEP consisting of:  1.  Walking every 30 min to 1 hour during the day with FWW   2.  THR protocol per handout given to pt for supine and sitting exercises.    Written HEP issued, patient verbalized understanding.   Patient Level of understanding of education provided: pt able to return demo HEP for supine and sitting exercise with assistance and cues for proper technique.   Patient response to treatment: Pt reported  increase in L hip pain throughout treatment intermittantly with movement and exercises.   CONTINUED NEED FOR THE FOLLOWING SKILLS: HH PT is medically necessary to  address pain, decreased ROM of L hip/knee due to pain,  decreased strength, increased swelling, decreased independence with bed mobility and functional transfers, impaired gait, and impaired balance in order to improve functional independence, quality of life, return to PLOF, and reduce the risk for falls.  PLAN: 1w1, 4w1, 3w3 until f/u with Dr. Broadus [name] .  Pt is approved for 8 visits at this time for insurance but do recommend pt will need more visits unless is DC to outpt setting at f/u appt.   DISCHARGE PLANNING DISCUSSED: Discharge to self and family under MD supervision once all goals have been met or patient has reached max potential.

## 2023-04-04 ENCOUNTER — Encounter: Admit: 2023-04-04 | Discharge: 2023-04-04 | Payer: BLUE CROSS/BLUE SHIELD | Primary: Case Management

## 2023-04-04 NOTE — Home Health (Signed)
SUBJECTIVE: "I was hurting so bad last night. I won't have enough Dilaudid, so I tried to take Tylenol, and it did absolutely nothing. Anytime I move, it hurts. I've been doing the exercises in the bed. Right now it's about a 7, and got to an 8 last night."    CAREGIVER INVOLVEMENT/ASSISTANCE NEEDED FOR: Pt's mother present today assisting w/ meals and household chores.   .  OBJECTIVE: See interventions.    PATIENT EDUCATION PROVIDED THIS VISIT: Fall Prevention, Pain Management, Gait Mechanics - see interventions.   Increase walking frequency every hour. Do not sit for more than 30 minutes at a time to prevent stiffness.   Add a thicker comforter/blanket underneath couch cushion to elevate seat surface - will allow pt to come out of the bedroom and prevent social isolation/encourage increased walking.     PATIENT RESPONSE TO EDUCATION PROVIDED: See Assessment/Interventions - Pt verbalized understanding.    PATIENT RESPONSE TO TREATMENT:   Pt reported 8/10 pain s/p interventions today. Pt able to transfer from the edge of bed the couch maintaining proper hip alignment and maintaining hip precautions. Note good carry over of gait mechanics corrections to normalize and encourage reciprocal pattern.   .  ASSESSMENT OF PROGRESS TOWARD GOALS:   Pt presents today w/ elevated pain levels, however actively participated in therapy session today. She presents today lying in bed using Hip Wedge, and requires assistance from her mother and son for set up. Pt able to complete supine and seated there ex today w/ reported increased pain during active assisted heel slides. No sis/ of infection, and mild edema noted today at L hip surgery site, as pt has been compliant w/ ice. She would benefit from continued skilled interventions to address deficits associated w/ recent THR - decreased strength, decreased ROM, increased pain, impaired balance and transfers, progress there ex, pain management and fall prevention, all to restore pt's  (I) w/ her ADLs/IADLs and community mobility.   Marland Kitchen  PLAN FOR NEXT VISIT: Continue strengthening there ex, gait and transfer training.     THE FOLLOWING DISCHARGE PLANNING WAS DISCUSSED WITH THE PATIENT/CAREGIVER: HH PT frequency 1w1, 4w1, 3w3 and DC to self, family, and under MD supervision when goals met, or max benefits achieved. - pt verbalized understanding.

## 2023-04-05 ENCOUNTER — Encounter: Admit: 2023-04-05 | Discharge: 2023-04-05 | Payer: BLUE CROSS/BLUE SHIELD | Primary: Case Management

## 2023-04-05 NOTE — Home Health (Signed)
S:patient reports that her legs are "different lengths"  PT measured ASIS to medial malleolus on each leg   RIGHT LE 32.25   LEFT LE 34 INCHES  shoe is 2" heel/sole that she is wearing on the right fooot (croc)  O: PAIN: patient is taking pain medication as needed- educated patient on use of ice for pain and edema  patient to apply ice to the  eft hip for pain and swelling control per MD protocol using a barrier to protect the skin. Patient to monitor the skin for any adverse effects such as color changes, irritation, mottled appearance. Patient to use ice for 20 minutes an hour for the first 2-3 days and then can reduce to 20 minutes 4-5 times a day.  WOUND:left hip covered in the aquacel bandage  - wound not visible. surrouding the bandage there is moderate swelling without redness. per MD orders did not remove the bandage but educated patient on signs of infection and to NOT remove the bandage, superior third of the bandage has a small area of dried drainage - no active drainage evident at this time  ROM: L hip flexion in supine hel slide 65 degrees  L hip flexion in standing 60 degrees with some compensatory hip hiking   STRENGTH: R knee ext 4+/5, R knee flexion 4+/5, R hip flex 4/5, R hip abd/adduction 4/5  L knee flexion and extension 3+/5, L hip flexion/abduction and adduction 2-/5  BED MOBILITY: patient is completing bed mobility with SBA to min A depending on her pain  EQUIPMENT: FWW  TRANSFERS: patient completed sit to stand from the bed with exaggerated BOS, maintenance of the L LE extended out in front of her, full weight shift to the R LE to completely unweight the L LE. Requires use of walker for initial stance for balance, safety and to unweight the L LE.   GAIT: ambulating with FWW with minimal weight bearing through the L LE (max weight through UE on the walker), step to pattern with use of hip hiking for L LE swing through- slow cadence noted with antalgic pattern. used a croc sandal with a 2 inch  sole to assist with evening out her leg length and her pelvis to increase the safety with symmetry of gait  STEPS: NA   RESPONSE TO TREATMENT: patient demonstrated a positive outcome post treatment and reported a reduction in pain, increased comfort and increased confidence with transfers and mobility. Patient reported good understanding of the HEP and reports confidence in ability to complete the program as prescribed by PT  RESPONSE TO EDUCATION: patient was educated on: safety, use of shoe to even out her pelvis and promote symmetry  Patient expressed understanding of all education provided and was able to repeat back education, precautions, and HEP.  A:ASSESSMENT AND PROGRESS TOWARD GOALS:  Patient demonstrated a positive result to therapy this date as evidenced by an improvement in gait pattern with cues, decreased pain with exercise and walking and patient expressing an understanding of the rehab plan due to therapy verbal and written instructions. Goals established for increased independence in the home, safe mobility in the home, improvement in strength and ROM - all designed to reduce fall risk and progress toward independence.  Patient will benefit from continuedPT to address the above deficits  P: continue PT    Skilled services/Home bound verification: MD referral for HHPT following recent hospital stay. HHPT is medically necessary to address  dx and clinical findings: decreased ROM, decreased strength,  impaired gait, decreased ability w stair negotiation, increased swelling,decreased transfer status, decreased endurance, decreased balance and decreased safety, increased pain in order to improve functional mobility/quality of life, decrease burden of care, reduce risk for re-hospitalization, work towards patient's personal goals of return to PLOF w decrease risk for falls.    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 Her family assists with all needed care including ADLs, mobility, meals, medications and transportation    Medications reconciled and all medications are available in the home this visit.  The following education was provided regarding medications, medication interactions, and look a like medications: Meds are effective at this time. no discrepancies noted    Home health supplies ordered/delivered this visit include: none    Patient education provided: safety, fall risk, HEP.  Patient/caregiver degree of understanding:good    Home exercise program: supine ankle pumps, quad sets, heel slides, SAQ  seated LAQ, isometric hip adduction   walking every hour during waking hours   ice at least 4 times a day for 20 minutes, more if needed    Discharge planning discussed with patient and caregiver. DC to self and family under MD supervision when all goals are met or maximum potential is reached.  Pt/Caregiver did verbalize understanding of DC planning.     Next MD appointment TBD with Dr Broadus [name]  Patient/caregiver instructed to keep appointment as lack of follow through with MD appointment could result in discontinuation of home care services for non-compliance.

## 2023-04-07 ENCOUNTER — Encounter: Payer: BLUE CROSS/BLUE SHIELD | Primary: Case Management

## 2023-04-07 ENCOUNTER — Encounter: Admit: 2023-04-07 | Discharge: 2023-04-07 | Payer: BLUE CROSS/BLUE SHIELD | Primary: Case Management

## 2023-04-07 NOTE — Home Health (Signed)
S:patient reports that she is doing okay after the fall but is more sore and ehr thigh is very sore   POST FALL:   Date and Time of Fall: 04/06/23 in the morning   SOC/ROC Date: 04/03/23  Fall observed by [facility] Staff? no  Describe Event and Document any re-training or treatment plan modifications indicated: patient reports that she was in the shower sitting on the tub bench and the tub bench "broke" (she reports that one of te brackets broke) and she fell. Her mother and son assisted her with getting up out of the tub. Patient then called Dr Broadus [name] and she went in to the office for an xray. per patient report the xray was "ok"   Response to re-training or treatment plan modifications: educated patient on safety and recommendation to sponge bathe at this time for safety   Assisted Devices used by patient prior to fall: FWW  Was equipment in use at time of fall? tub bench in use  Injury (Yes / No), If yes, describe: no but patient went to MD to rule out injury - patient has increased pain but no injuries per patient report (per MD)  Emergent Care Received:no  Was patient identified as High Risk for falls?yes  List Tests Performed, Scores of Tests, and Patient Risk Factors:   MD Notified (name and time): patient notified Dr Ned Clines office after it happened and was seen in the office    O: PAIN: patient is taking pain medication as needed- educated patient on use of ice for pain and edema  patient to apply ice to the left hip for pain and swelling control per MD protocol using a barrier to protect the skin. Patient to monitor the skin for any adverse effects such as color changes, irritation, mottled appearance. Patient to use ice for 20 minutes an hour for the first 2-3 days and then can reduce to 20 minutes 4-5 times a day.  WOUND:left hip covered in the aquacel bandage  - wound not visible. surrouding the bandage there is moderate swelling without redness. per MD orders did not remove the bandage but educated patient on  signs of infection and to NOT remove the bandage, superior third of the bandage has a small area of dried drainage - no active drainage evident at this time- less than 25% coverage on the bandage   ROM: L hip flexion in supine hel slide 65 degrees  L hip flexion in standing 60 degrees with some compensatory hip hiking   NOT RETESTED TODAY  STRENGTH: R knee ext 4+/5, R knee flexion 4+/5, R hip flex 4/5, R hip abd/adduction 4/5  L knee flexion and extension 3+/5, L hip flexion/abduction and adduction 2-/5  NOT RETESTED TODAY   BED MOBILITY: patient is completing bed mobility with SUPERVISION - cues to use scootig back on the bed to get her left LE/thigh on the bed to assist with lifting the rest of the leg on the bed   EQUIPMENT: FWW  TRANSFERS: patient completed sit to stand from the bed with supervision and use of the right LE more than the left - using the walker for standing balance and to unweight the left LE  GAIT: ambulating with FWW with reduced weight bearing through the L LE step through pattern with left step length greater than right, use of hip hiking for L LE swing through and antalgic pattern. - walked 30 feet today   STEPS: NA   RESPONSE TO TREATMENT: patient demonstrated a  positive outcome post treatment and reported mild increase in pain after the fall, but continues with functional mobility and safety with transfers and mobility. Patient reported good understanding of the HEP and reports confidence in ability to complete the program as prescribed by PT    RESPONSE TO EDUCATION: patient was educated on: safety, use of shoe to even out her pelvis and promote symmetry  Patient expressed understanding of all education provided and was able to repeat back education, precautions, and HEP.    A:ASSESSMENT AND PROGRESS TOWARD GOALS:  Patient demonstrated a positive result to therapy this date as evidenced by an improvement in gait pattern with cues, decreased pain with exercise and walking and patient  expressing an understanding of the rehab plan due to therapy verbal and written instructions. Goals established for increased independence in the home, safe mobility in the home, improvement in strength and ROM - all designed to reduce fall risk and progress toward independence.  Patient will benefit from continuedPT to address the above deficits    PATIENT HAS INCREASED PAIN IN THE LEFT THIGH SINCE THE FALL FROM THE SHOWER CHAIR - QUADS AND IT BAND SORE TO THE TOUCH AND WITH ACTIVATION- NO VISIBLE BRUISING NOTED AT THIS TIME. SHE WAS EDUCATED ON NEED TO DO EXERCISES TO TOLERANCE AND USE ICE CONSISTENTLY FOR PAIN AND SWELLING CONTROL - SHE EXPRESSED UNDERSTANDING     P: continue PT     Skilled services/Home bound verification: MD referral for HHPT following recent hospital stay. HHPT is medically necessary to address  dx and clinical findings: decreased ROM, decreased strength, impaired gait, decreased ability w stair negotiation, increased swelling,decreased transfer status, decreased endurance, decreased balance and decreased safety, increased pain in order to improve functional mobility/quality of life, decrease burden of care, reduce risk for re-hospitalization, work towards patient's personal goals of return to PLOF w decrease risk for falls.    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 Her family assists with all needed care including ADLs, mobility, meals, medications and transportation    Medications reconciled and all medications are available in the home this visit.  The following education was provided regarding medications, medication interactions, and look a like medications: Meds are effective at this time. no discrepancies noted    Home health supplies ordered/delivered this visit include: none    Patient education provided: safety, fall risk,  HEP.  Patient/caregiver degree of understanding:good     Home exercise program: supine ankle pumps, quad sets, heel slides, SAQ  seated LAQ, isometric hip adduction   walking every hour during waking hours   ice at least 4 times a day for 20 minutes, more if needed    Discharge planning discussed with patient and caregiver. DC to self and family under MD supervision when all goals are met or maximum potential is reached.  Pt/Caregiver did verbalize understanding of DC planning.     Next MD appointment TBD with Dr Broadus [name]  Patient/caregiver instructed to keep appointment as lack of follow through with MD appointment could result in discontinuation of home care services for non-compliance.

## 2023-04-09 ENCOUNTER — Encounter: Admit: 2023-04-09 | Discharge: 2023-04-09 | Payer: BLUE CROSS/BLUE SHIELD | Primary: Case Management

## 2023-04-09 NOTE — Home Health (Signed)
SUBJECTIVE: The pt answered the door with the use of the rollator walker, additional time and effort required.   Jillian Harvey  DATE OF SURGERY: 04/01/23 LTHR Dr. Broadus Harvey  .  PAIN: see pain assessment  OBJECTIVE: see interventions  .  NEXT MD APPT: 04/14/23 f/u with Dr. Broadus Harvey  .  CAREGIVER ASSISTANCE NEEDED FOR: The pt lives with her mother and son who assist with care as needed: shopping, meals, medication management, safety.   .  ASSESSMENT AND PROGRESS TOWARD GOALS:  The pt demonstrated a positive result in HHPT on this date as evidence of improved gait mechanics with education. She continues to report increased pain that is limiting her overall activity, but within normal expectations post THR with a recent fall. She will benefit from continued HHPT to increase strength and balance and address pain and edema to improve mobility and gait, returning the pt to an improved quality of life with reduced risks of falls.   PATIENT RESPONSE TO TREATMENT: no increase in pain post activity.   PATIENT LEVEL OF UNDERSTANDING OF EDUCATION: Good - the pt able to teach back the HEP with the use of handouts.   .  CONTINUED NEED FOR THE FOLLOWING SKILLS: HH PT is medically necessary to address pain, decreased ROM, decreased strength, increased swelling, impaired bed mobility, decreased independence with functional transfers, impaired gait, impaired stair negotiation, and impaired balance in order to improve functional independence, quality of life, return to PLOF, reduce the risk for falls, and reduce pain.  .  INTERDISCPLINARY TEAM MEETING LAST COMPLETED ON:  Supervisory visit with Tomasita Morrow, PT on 04/07/23  .  PLAN: Plan to continue to progress the strength program for improved mobility and gait.    DISCHARGE PLANNING DISCUSSED: Discharge to self and family under MD supervision once all goals have been met or patient has reached maximum potential. Discussed with the pt the POC to continue HHPT with the remaining frequency of 3w3. The pt is in  agreement to the POC at this time.

## 2023-04-12 ENCOUNTER — Encounter: Admit: 2023-04-12 | Discharge: 2023-04-12 | Payer: BLUE CROSS/BLUE SHIELD | Primary: Case Management

## 2023-04-12 NOTE — Home Health (Signed)
SUBJECTIVE: The pt seen lying in the bed upon today's arrival. She continues to report increased levels of pain  .    DATE OF SURGERY: 04/01/23 LTHR Dr. Broadus [name]  .    PAIN: see pain assessment    OBJECTIVE: see interventions  .    NEXT MD APPT: 04/14/23 f/u with Dr. Broadus [name]  .    CAREGIVER ASSISTANCE NEEDED FOR: The pt lives with her mother and son who assist with care as needed: shopping, meals, medication management, safety.   .    ASSESSMENT AND PROGRESS TOWARD GOALS:  The pt continues to be overall limited by pain which also creates fatigue. She did demonstrate improvements on this date as noted with the ability to consistenly perform bed mobility with (I) and the ability to perform sit<>stand to/from the EOB and dining room chair, mod(I). She was able to progress the strengthening program; however, noted fatigue as she would rest upon her elbows in standing and take frequent breaks thorughout the program.   PATIENT RESPONSE TO TREATMENT: no increase in pain post activity.     PATIENT LEVEL OF UNDERSTANDING OF EDUCATION: Good - the pt able to teach back the HEP with the use of handouts.   .    CONTINUED NEED FOR THE FOLLOWING SKILLS: HH PT is medically necessary to address pain, decreased ROM, decreased strength, increased swelling, decreased independence with functional transfers, impaired gait, impaired stair negotiation, and impaired balance in order to improve functional independence, quality of life, return to PLOF, reduce the risk for falls, and reduce pain.  Marland Kitchen    PLAN: Plan to readdress outside ambulation and review the HEP  next visit.    DISCHARGE PLANNING DISCUSSED: Discharge to self and family under MD supervision once all goals have been met or patient has reached maximum potential. Discussed with the pt the POC to continue HHPT with the remaining frequency of  2 more visit this week then 3w2. The pt is in agreement to the POC at this time.

## 2023-04-13 ENCOUNTER — Encounter: Admit: 2023-04-13 | Discharge: 2023-04-13 | Payer: BLUE CROSS/BLUE SHIELD | Primary: Case Management

## 2023-04-13 NOTE — Home Health (Signed)
SUBJECTIVE: The pt states that she is doing more activity lately, she reports just finishing a shower and moving through the home prior to therapist's arrival. The pt seen walking a few steps in the bedroom without the use of an AD.   Marland Kitchen  DATE OF SURGERY: 04/01/23 LTHR Dr. Broadus [name]  .  PAIN: see pain assessment  OBJECTIVE: see interventions  .  NEXT MD APPT: 04/14/23 f/u with Dr. Broadus [name]  .  CAREGIVER ASSISTANCE NEEDED FOR: The pt lives with her mother and son who assist with care as needed: shopping, meals, medication management, safety.   .  ASSESSMENT AND PROGRESS TOWARD GOALS:  The pt demonstrated a positive restult in HHPT on this date as noted by improved gait mechanics with education and reports of decreased pain with increased activity. She is mod(I) with outside ambulation with the use of the rollator walker on outside surfaces. She does continue to report increased pain levels, but slowly reducing overall. The pt will benefit from HHPT to progress strength, balance, and endurance and may appropriate for an early DC as she is progressing towards goals at this time.     PATIENT RESPONSE TO TREATMENT: 7/10 pain level pre PT, 4/10 pain level post PT    PATIENT LEVEL OF UNDERSTANDING OF EDUCATION: Good - the pt able to teach back the HEP with the use of the handouts.   Marland Kitchen  PLAN: Plan to progress the strengthening program next visit.     DISCHARGE PLANNING DISCUSSED: Discharge to self and family under MD supervision once all goals have been met or patient has reached maximum potential. Discussed with the pt the POC to continue HHPT with the remaining frequency of  1 more visit this week then 3w2 unless an early DC is appropriate. The pt is in agreement to the POC at this time.

## 2023-04-14 ENCOUNTER — Encounter: Payer: BLUE CROSS/BLUE SHIELD | Primary: Case Management

## 2023-04-16 ENCOUNTER — Encounter: Admit: 2023-04-16 | Discharge: 2023-04-16 | Payer: BLUE CROSS/BLUE SHIELD | Primary: Specialist

## 2023-04-16 NOTE — Home Health (Signed)
SUBJECTIVE: The pt states she will be starting outpatient PT Thursday April 25. She states that Dr. Broadus [name] instructed her to wear a .5 inch lift in the R shoe at this time.   Marland Kitchen  DATE OF SURGERY: 04/01/23 LTHR Dr. Broadus [name]  .  PAIN: see pain assessment    OBJECTIVE: see interventions  .  NEXT MD APPT: TBD  .  CAREGIVER ASSISTANCE NEEDED FOR: The pt lives with her mother and son who assist with care as needed: shopping, meals, medication management, safety.   .  ASSESSMENT AND PROGRESS TOWARD GOALS/PATIENT RESPONSE TO TREATMENT: The pt has improved nicely with functional mobility and pain management over the course of this episode of care. She did have one fall that she was cleared of any injury with a visit to Dr. Broadus [name]. Her incision is nicely healing with no s/s of infection. She is (I) with her current HEP, (I) with bed mobility, Mod(I) with gait and transfers with either the [facility] or rollator walker. She will benefit from the transition to outpatient PT for progressive rehabilitation for her L THR as well as due to multiple co-morbitities.     PATIENT LEVEL OF UNDERSTANDING OF EDUCATION: Good - the pt able to teach back the HEP with the use of the handouts.   Marland Kitchen  PLAN: Reassessment with planned DC with Tomasita Morrow, PT next visit.   Marland Kitchen  DISCHARGE PLANNING DISCUSSED: Discharge to self and family under MD supervision once all goals have been met or patient has reached maximum potential. Discussed with the pt the POC to continue HHPT with the remaining frequency of  1 more visit next week with planned DC at that time to transition to outpatient PT. The pt is in agreement to the POC at this time.

## 2023-04-16 NOTE — Unmapped (Unsigned)
Code Status:  Full Code

## 2023-04-16 NOTE — Case Communication (Signed)
tramadol  take 1 tablet by mouth every 8 hours as needed for pain  the pt is no longer taking the dilaudid

## 2023-04-16 NOTE — Case Communication (Signed)
The pt starts outpatient PT on Thursday April 25  .  The pt has improved nicely with functional mobility and pain management over the course of this episode of care. She did have one fall that she was cleared of any injury with a visit to Dr. Broadus [name]. Her incision is nicely healing with no s/s of infection. She is (I) with her current HEP, (I) with bed mobility, Mod(I) with gait and transfers with either the [facility] or rollator walker. She w ill benefit from the transition to outpatient PT for progressive rehabilitation for her L THR as well as due to multiple co-morbitities.

## 2023-04-19 ENCOUNTER — Encounter: Admit: 2023-04-19 | Discharge: 2023-04-19 | Payer: BLUE CROSS/BLUE SHIELD | Primary: Case Management

## 2023-04-19 NOTE — Home Health (Signed)
S:patient reports she is doing well and is starting outpatient PT on Thursday this week  O: PAIN: patient is taking pain medication as needed- educated patient on use of ice for pain and edema  patient to apply ice to the left hip for pain and swelling control per MD protocol using a barrier to protect the skin. Patient to monitor the skin for any adverse effects such as color changes, irritation, mottled appearance. Patient to use ice for 20 minutes 4-5 times a day.  ROM: L hip flexion in supine hel slide 65 degrees AT EVALUATION  L hip flexion in standing 60 degrees with some compensatory hip hiking AT EVALUATION  L hip flexion in standing 85 degrees AT DISCHARGE  STRENGTH: R knee ext 4+/5, R knee flexion 4+/5, R hip flex 4/5, R hip abd/adduction 4/5  L knee flexion and extension 4/5, L hip flexion/abduction and adduction 3-/5  BED MOBILITY: INDEPENDENT  EQUIPMENT: FWW  TRANSFERS: indepedent with transfers in and out of the home. encouraged patient to use B LEs equally during the transitions to conotnue to strengthening the left LE  GAIT: She is ambulating on even and uneven surfaces using a SPC with modified independence. She is able to demonstrate symmetrical step length and functional foot clearance with occasional verbal cues to avoid left hip hiking during swing. She demonstrates good safety with transitions between surfaces and with changes in direction. She does demonstrates some forward flexion and lateral flexion of the trunk with walking due to a leg length discrepancy. She is currently wearing a heel lift to assist with increasing symmetry of stance and during gait. She was educated on need to work on trunk strengthening as well and L hip/LE strengthening. She was educated on trunk exercises and postural correction to assist with this  RESPONSE TO TREATMENT: patient demonstrated a positive outcome post treatment and reported no increase in pain Patient reported good understanding of the HEP and reports  confidence in ability to complete the program as prescribed by PT  RESPONSE TO EDUCATION: patient was educated on: safety, use of heel lift due to LLD  Patient expressed understanding of all education provided and was able to repeat back education, precautions, and HEP  A:ASSESSMENT AND PROGRESS TOWARD GOALS:  Patient was seen for home care PT following a left THR by Dr Broadus [name]. She has done very well and gas made great progress in all areas. She is independent with all bed mobility and transfers in and out of the home. She is ambulating on even and uneven surfaces using a SPC with modified independence. She is able to demonstrate symmetrical step length and functional foot clearance with occasional verbal cues to avoid left hip hiking during swing. She demonstrates good safety with transitions between surfaces and with changes in direction. She does demonstrates some forward flexion and lateral flexion of the trunk with walking due to a leg length discrepancy. She is currently wearing a heel lift to assist with increasing symmetry of stance and during gait. She was educated on need to work on trunk strengthening as well and L hip/LE strengthening. She was educated on trunk exercises and postural correction to assist with this. Her left hip flexion in standing is 85 degrees. She is independent with her HEP and is ready to transition to outpatient PT at this time. She is being discharged from home care PT effective today.  P: DC PT      Skilled Reason for admission/summary of clinical condition:  s/p L  THR by Dr Broadus [name]  Caregiver: lives with her family in a one story first floor apartment with one door threshold to enter and egress. Her family assists with all needed care including ADLs, mobility, meals, medications and transportation  Medications reconciled and all medications are available in the home this visit.  The following education was provided regarding medications, medication interactions, and look a like  medications: Meds are effective at this time. no discrepancies noted  Home health supplies ordered/delivered this visit include: none  Patient education provided: safety, fall risk, HEP.  Patient/caregiver degree of understanding:good   Home exercise program: 1) stand/walk with an AD every waking hour   2) ice as instructed   3) daily walking program, increasing time/distance as tolerated  4) ther exs 3 times per day   seated: LAQ x 10   supine: APs, QS/GS, heel slides, SAQs x 10   standing: (with bil UE support): calf raises, LLE/RLE alt hip abd toe taps, LLE/RLE alt HS curls, LLE hip flex (knee bent) x 10   5x STS

## 2023-04-21 ENCOUNTER — Encounter: Payer: BLUE CROSS/BLUE SHIELD | Primary: Case Management

## 2023-04-21 NOTE — Unmapped (Signed)
I certify that this patient is confined to his/her home and needs intermittent skilled nursing care, physical therapy and/or speech therapy or continues to need occupational therapy.  The patient is under my care, and I have authorized services on this plan of care and will periodically review the plan.  The patient had a face-to face encounter with an allowed provider type on 04/01/2023 and the encounter was related to the primary reason for home health care.

## 2023-04-23 ENCOUNTER — Encounter: Payer: BLUE CROSS/BLUE SHIELD | Primary: Case Management

## 2023-04-26 ENCOUNTER — Encounter: Payer: BLUE CROSS/BLUE SHIELD | Primary: Case Management

## 2023-04-28 ENCOUNTER — Encounter: Payer: BLUE CROSS/BLUE SHIELD | Primary: Case Management

## 2023-04-30 ENCOUNTER — Encounter: Payer: BLUE CROSS/BLUE SHIELD | Primary: Case Management

## 2023-06-04 ENCOUNTER — Inpatient Hospital Stay: Admit: 2023-06-04 | Payer: BLUE CROSS/BLUE SHIELD | Primary: Specialist

## 2023-06-04 DIAGNOSIS — Z01812 Encounter for preprocedural laboratory examination: Secondary | ICD-10-CM

## 2023-06-04 LAB — BASIC METABOLIC PANEL
Anion Gap: 8 mmol/L (ref 5–15)
BUN: 10 mg/dl (ref 9–23)
CO2: 26 mEq/L (ref 20–31)
Calcium: 9.4 mg/dl (ref 8.7–10.4)
Chloride: 105 mEq/L (ref 98–107)
Creatinine: 0.84 mg/dl (ref 0.55–1.02)
GFR African American: >60
GFR Non-African American: >60
Glucose: 91 mg/dl (ref 74–106)
Potassium: 3.8 mEq/L (ref 3.5–5.1)
Sodium: 139 mEq/L (ref 136–145)

## 2023-06-04 LAB — ABO/RH: ABO/Rh: O NEG

## 2023-06-04 LAB — CBC WITH AUTO DIFFERENTIAL
Basophils: 0.9 % (ref 0–3)
Eosinophils: 4.9 % (ref 0–5)
Hematocrit: 44 % (ref 35.0–47.0)
Hemoglobin: 14.5 gm/dl (ref 11.0–16.0)
Immature Granulocytes %: 0.3 % (ref 0.0–3.0)
Lymphocytes: 25 % — ABNORMAL LOW (ref 28–48)
MCH: 31.2 pg (ref 25.4–34.6)
MCHC: 33 gm/dl (ref 30.0–36.0)
MCV: 94.6 fL (ref 80.0–98.0)
MPV: 8.5 fL (ref 6.0–10.0)
Monocytes: 8 % (ref 1–13)
Neutrophils Segmented: 60.9 % (ref 34–64)
Nucleated RBCs: 0 (ref 0–0)
Platelets: 349 10*3/uL (ref 140–450)
RBC: 4.65 M/uL (ref 3.60–5.20)
RDW: 46.8 — ABNORMAL HIGH (ref 36.4–46.3)
WBC: 7 10*3/uL (ref 4.0–11.0)

## 2023-06-04 LAB — PROTIME-INR
INR: 1 (ref 0.1–1.1)
Protime: 11.4 seconds (ref 10.2–12.9)

## 2023-06-04 LAB — ANTIBODY SCREEN: Antibody Screen: NEGATIVE

## 2023-06-04 LAB — APTT: aPTT: 35.6 seconds (ref 25.1–36.5)

## 2023-06-04 NOTE — Other (Signed)
06/04/23 1030   PAT PreOp Huddle   PAT PreOp Huddle coming in today to get labs done

## 2023-06-04 NOTE — Progress Notes (Signed)
PREOPERATIVE INSTRUCTIONS  Please Read Carefully    [x]  Your procedure is scheduled on: 06/08/23     [x]  The day before your surgery, call the surgeon's office to check on the surgery time and the time you should arrive.     [x]  On the day of your surgery, arrive at the time given to you by your surgeon and check in at the first floor registration desk.    [x]  Standard NPO Guidelines per Anesthesia:  [x]  DO NOT eat anything after midnight before your surgery. This includes gum, mints or hard candy.    []  May have water, black coffee (NO CREAM) or regular Gatorade (not sugar free) up to 3 hours prior to surgery start time, but no more than 6 ounces per hour (approx.  cup).    []  GLP-1 Medication NPO instructions per Anesthesia:  []  DO NOT eat anything after 5:00pm the night before your surgery. This includes gum, mints or hard candy.      []  May have Sips of water, black coffee (NO CREAM) or regular Gatorade (not sugar free) from 5:00pm to Midnight the night before your surgery.     []  DO NOT eat or drink anything after Midnight the night before your surgery.    []  Cardiac surgery NPO instructions per Anesthesia:  []  Please consume your Ensure PreSurgery drink no later than two hours prior to surgery start time.      []  Entire drink should be consumed within 5-10 minutes of starting.      [x]  You may brush your teeth the morning of surgery, however DO NOT swallow any water.    []  No smoking after midnight.  Smoking should be reduced a few days prior to surgery.    [x]  If you are having an outpatient procedure, you must have a responsible adult bring you to the hospital.  They must remain in the surgical waiting area for the duration of your procedure and provide you with transportation home.  You may not drive yourself home.  We also recommend that you arrange for a responsible person to stay with you for 24 hours following your procedure.  Failure to comply with these instructions may result in  cancellation of the procedure.    []  A parent or legal guardian must accompany minors (under the age of 22) to the hospital.  LEGAL GUARDIANS MUST bring custody papers with them the day of surgery.  A parent/legal guardian will be required to remain at the hospital throughout the minor's duration of stay--To include over night, if minor is admitted to the hospital.      [x]  If the lab gives you a blue blood ID band, do not throw it away.  Bring it with you on the day of surgery.      []  Please return to preop surgical testing no more than 14 days before surgery date to have your type and screen done prior to surgery.    []  If you have been diagnosed with Sleep Apnea and are using a CPAP, BiPAP, and/or Bi-Flex machine, please bring it with you the day of surgery.    []  Endoscopy patients, please follow the instructions provided by the doctors office.    [] If crutches are ordered, you must get them and be instructed on proper use before the day of surgery.  Please bring them with you the day of surgery.      PREPARING THE SKIN BEFORE SURGERY Can Reduce the Risk of Infection  []   You have been given a Chlorhexidine skin preparation packet with instructions to bathe the night before surgery and the morning of surgery prior to arrival.    [x]  If allergic to Chlorhexidine, use Dial (antibacterial) soap to bathe your skin the night before surgery and the morning of surgery.    [x]  Do not shave your face, underarms, legs or any part of your body at least 48 hours before surgery.  Any clipping needed for surgery will be done at the hospital.      PREPARING TO COME TO THE HOSPITAL  [x]  Wear casual, loose fitting comfortable clothes the day of surgery.    [x]  Remove ALL jewelry, including body piercings (failure to remove jewelry/piercings/dermal implants prior to surgery may contribute to electrical burns, infections, skin tears, airway obstruction, loss of circulation/oxygenation).  Leave jewelry and all valuables at home.   Leave suitcases and/or overnight bag at home or in the car for a family member to bring when you get a room.    [x]  DO NOT wear any makeup, deodorant, body lotion, aftershave or contact lenses the day of surgery.   Please bring your glasses and glasses case with you the day of surgery.    [x]  DO NOT wear any fingernail polish, artificial fingernails, or fingernail decoration (nothing other than your natural nails).  The fingernail is used to measure oxygen delivery to the body, these items inhibit the ability to measure this--surgery cannot be performed without the ability to accurately measure oxygen delivery to the body.    [x]  Bring any additional paperwork, such as doctors orders, consent form, POA (power of attorney), and/or advanced directives with you the day of surgery.    [x]  Please notify your surgeon of any changes in your condition such as fever, sore throat or rash as soon as possible before your surgery date.  After office hours, call your surgeon's answering service.    [x] Bring picture identification and insurance cards with you the day of surgery.      MEDICATIONS:  [x]  Stop taking aspirin and aspirin products/NSAIDs (non-steroidal anti-inflammatory drugs such as Motrin, Aleve, Advil, ibuprofen and BC Powder), vitamins and herbal pills 2 weeks before surgery OR as instructed by your doctor.  However, if you take a daily aspirin, please contact your primary care provider (PCP), for instructions prior to surgery.    []  Contact your doctor regarding any blood thinner medications you take (such as Coumadin, Plavix, etc.) for instructions about what to do prior to surgery.    [] If you have diabetes, hold oral diabetic medications the night before surgery and the morning of surgery.  Hold insulin the morning of surgery unless told otherwise by your doctor.    []  If you have asthma or use inhalers please bring them the day of surgery.    [x] Take the following medications (oral medications with a sip of  water)  the day of surgery:   Lexapro, dilaudid, albuterol, flexeril pepcid lyrica        If needed:            Prior to Visit Medications    Medication Sig Taking? Authorizing Provider   benzonatate (TESSALON) 100 MG capsule Take 1 capsule by mouth 2 times daily as needed Yes [provider]   escitalopram (LEXAPRO) 10 MG tablet Take 3 tablets by mouth daily Yes [provider]   Ferrous Sulfate (IRON PO)  Yes [provider]   traZODone (DESYREL) 50 MG tablet 1 tablet Yes [provider]   traMADol (ULTRAM) 50 MG tablet Take 50 mg by mouth every 8 hours as needed for Pain.  [provider]   acetaminophen (TYLENOL) 500 MG tablet Take 1,000 mg by mouth every 8 hours as needed for Pain.  [provider]   HYDROmorphone (DILAUDID) 2 MG tablet Take 2 mg by mouth every 4 hours as needed for Pain. 1-2 tablets  [provider]   albuterol sulfate HFA (PROVENTIL;VENTOLIN;PROAIR) 108 (90 Base) MCG/ACT inhaler Inhale 2 puffs into the lungs every 4 hours as needed for Shortness of Breath.  [provider]   diphenhydrAMINE HCl (BENADRYL PO) Take 25 mg by mouth in the morning and 25 mg in the evening. take with famotidine to help prevent rash on face    [provider]   cyclobenzaprine (FLEXERIL) 10 MG tablet Take 10 mg by mouth 3 times daily.  [provider]   DULoxetine (CYMBALTA) 30 MG extended release capsule Take 30 mg by mouth daily.  [provider]   famotidine (PEPCID) 20 MG tablet Take 20 mg by mouth 2 times daily.  [provider]   hydrOXYzine HCl (ATARAX) 25 MG tablet Take 25 mg by mouth every 8 hours as needed for Anxiety.  [provider]   omeprazole (PRILOSEC) 40 MG delayed release capsule Take 40 mg by mouth daily.  [provider]   pregabalin (LYRICA) 75 MG capsule Take 75 mg by mouth 2 times daily.  [provider]   PROBIOTIC, LACTOBACILLUS, PO Take 1 tablet by mouth  daily.  [provider]        *Visitor Policy-Surgical/Procedural Areas: Two people may escort the patient to the department's waiting room and may stay there during the procedure.  One visitor must be 93 years of age or older and the patient must arrive with their post procedure transportation home before procedure start time. The post-procedure transporting party must remain within the surgical waiting room for the duration of the procedure. Failure may result in the cancellation of the procedure*  Surgical Admitting Unit (SAU)/Phase II/Post Anesthesia Care Unit (PACU): Patients are allowed one visitor at a time. Visitors must be 16 or older. Note: If the patient's time in PACU is expected to be less than one hour, visitation may occur in Phase II Recovery or the patient's room. Visitation is generally limited to 5 - 10 minutes. Pediatric patients are allowed two visitors to accommodate both parents if the patient is hemodynamically stable.    We want you to have a positive experience at [facility].  If any of these these instructions are not met, it is possible that your surgery will be canceled.  If you have any questions regarding your surgery, please call [facility] at [phone] or your surgeon's office for further assistance.           [facility]    [facility] Anesthesia     There are many ways to perform anesthesia for surgery.  The 2 techniques are generally classified as General Anesthesia and Regional Anesthesia.  While both techniques are very safe, they are distinctly different.  As with any anesthesia, there are risks, which may be increased if you already have heart disease, chronic lung conditions, or other serious medical problems.    General anesthesia puts you to sleep for your surgery.  It acts on your brain and nerves, and affects your entire body.  It may be administered by an injection, or through inhaling  medication.  After you are asleep, a breathing tube  may be placed in your windpipe to help you breathe during surgery.  General anesthesia may be more appropriate for longer or more involved surgery, especially if the position you will be in surgery is uncomfortable.  With general anesthesia you may experience a sore throat and hoarse voice for a few days, headache, nausea/vomiting, drowsiness, or blood pressure and breathing problems.    Regional anesthesia involves blocking the nerves to a specific area of the body with local anesthesia medication ("numbing medicine").  It is usually given in conjunction with varying degrees of twilight sedation.  When at all possible, the recommended type of anesthesia for both total knee replacement (TKA) and total hip replacement (THA) is a regional anesthesia technique.  This can be achieved utilizing a spinal block technique, or third placement of an epidural catheter.  In addition, your anesthesiologist may recommend that you have a peripheral nerve block placed to help with pain after the procedure.    -Spinal block-In a spinal block, local anesthetic (i.e. numbing medicine) is injected into the fluid that baths the spinal cord in the lower part of your back.  This produces a rapid numbing effect that wears off in a few hours.  After meeting the anesthesiologist and discussing her medical conditions and history, the anesthesiologist may decide to place a long-acting pain medicine as well.  There are some medical conditions and home medications that may preclude a spinal block.    -Epidural block-An epidural block uses a catheter inserted into your lower back to deliver numbing medicine.  The epidural block in the spinal block are administered in a very similar location and technique; however, the epidural catheter is placed in a slightly different area around the spine as compared to a spinal block.    -Peripheral nerve block (PNB)-For TKA, the anesthesiologist may discuss performing a PNB.  This technique places local  anesthetic directly around the major nerves in your thigh, and one or multiple locations.  These blocks numb only the leg that is injected, and do not affect the other leg.  PNB's are used in addition to another anesthesia technique, and are for postoperative pain relief only.    The advantages of regional anesthesia for joint replacement surgery are considered to be significant.  There is a significant reduction in blood loss and blood transfusions, less nausea/vomiting, less drowsiness, improved pain control after surgery, better diabetes control, and less association with infection.  Additionally, there also are reduced risks (as compared to general anesthesia techniques) with serious medical complications such as heart attack, stroke, pneumonia, respiratory depression, or development of a blood clot in your legs that can go to your lungs.  Like with any technique, there are risks.  With regional anesthesia, there is a low incidence of headache and nerve damage.  Most commonly though, if the regional technique proves challenging, it is a difficulty in getting the numbing medicine in close proximity to the nerves perform the nerve block.  Extremely rarely (1 in 200,000), there can be a collection of blood from around the nerves.  Some patients may experience difficulty voiding (passing urine) for a period of time after a regional technique.

## 2023-06-07 NOTE — H&P (Signed)
Update History & Physical    The patient's History and Physical of June 07, 2023 was reviewed with the patient and I examined the patient. There was no change. The surgical site was confirmed by the patient and me.       Plan: The risks, benefits, expected outcome, and alternative to the recommended procedure have been discussed with the patient. Patient understands and wants to proceed with the procedure.     Electronically signed by Arita Miss, MD on 06/07/2023 at 3:25 PM

## 2023-06-08 ENCOUNTER — Inpatient Hospital Stay: Payer: BLUE CROSS/BLUE SHIELD | Attending: Specialist

## 2023-06-08 ENCOUNTER — Observation Stay: Admit: 2023-06-08 | Payer: BLUE CROSS/BLUE SHIELD | Primary: Specialist

## 2023-06-08 MED ORDER — DROPERIDOL 2.5 MG/ML IJ SOLN
2.5 | Freq: Once | INTRAMUSCULAR | Status: DC | PRN
Start: 2023-06-08 — End: 2023-06-08

## 2023-06-08 MED ORDER — HYDROMORPHONE HCL 1 MG/ML IJ SOLN
1 | INTRAMUSCULAR | Status: AC
Start: 2023-06-08 — End: ?

## 2023-06-08 MED ORDER — THROMBIN 5000 UNITS EX SOLR
5000 | CUTANEOUS | Status: AC
Start: 2023-06-08 — End: ?

## 2023-06-08 MED ORDER — NORMAL SALINE FLUSH 0.9 % IV SOLN
0.9 | INTRAVENOUS | Status: DC | PRN
Start: 2023-06-08 — End: 2023-06-08

## 2023-06-08 MED ORDER — CLINDAMYCIN PHOSPHATE IN NACL 900-0.9 MG/50ML-% IV SOLN
INTRAVENOUS | Status: AC
Start: 2023-06-08 — End: 2023-06-08
  Administered 2023-06-08: 14:00:00 900 mg via INTRAVENOUS

## 2023-06-08 MED ORDER — FENTANYL CITRATE (PF) 100 MCG/2ML IJ SOLN
100 | INTRAMUSCULAR | Status: DC | PRN
Start: 2023-06-08 — End: 2023-06-08

## 2023-06-08 MED ORDER — VANCOMYCIN 1500 MG NS 500 ML (PREMIX) IVPB
Status: AC
Start: 2023-06-08 — End: 2023-06-08
  Administered 2023-06-08: 12:00:00 1500 mg via INTRAVENOUS

## 2023-06-08 MED ORDER — LACTATED RINGERS IV SOLN
INTRAVENOUS | Status: DC
Start: 2023-06-08 — End: 2023-06-08

## 2023-06-08 MED ORDER — HYDROMORPHONE HCL 1 MG/ML IJ SOLN
1 | INTRAMUSCULAR | Status: DC | PRN
Start: 2023-06-08 — End: 2023-06-08
  Administered 2023-06-08 (×4): 0.25 mg via INTRAVENOUS

## 2023-06-08 MED ORDER — DEXMEDETOMIDINE HCL 200 MCG/2ML IV SOLN
200 MCG/2ML | INTRAVENOUS | Status: DC | PRN
  Administered 2023-06-08: 15:00:00 10 via INTRAVENOUS

## 2023-06-08 MED ORDER — THROMBIN (RECOMBINANT) 5000 UNITS EX SOLR
5000 | CUTANEOUS | Status: DC | PRN
Start: 2023-06-08 — End: 2023-06-08
  Administered 2023-06-08: 15:00:00 10000 via TOPICAL

## 2023-06-08 MED ORDER — ROCURONIUM BROMIDE 50 MG/5ML IV SOLN
50 MG/5ML | INTRAVENOUS | Status: DC | PRN
  Administered 2023-06-08: 14:00:00 50 via INTRAVENOUS

## 2023-06-08 MED ORDER — ONDANSETRON HCL 4 MG/2ML IJ SOLN
4 | Freq: Once | INTRAMUSCULAR | Status: DC | PRN
Start: 2023-06-08 — End: 2023-06-08

## 2023-06-08 MED ORDER — SODIUM CHLORIDE 0.9 % IV SOLN
0.9 | INTRAVENOUS | Status: DC | PRN
Start: 2023-06-08 — End: 2023-06-08

## 2023-06-08 MED ORDER — OXYCODONE HCL 5 MG PO TABS
5 | Freq: Once | ORAL | Status: DC | PRN
Start: 2023-06-08 — End: 2023-06-08

## 2023-06-08 MED ORDER — CLINDAMYCIN PHOSPHATE IN D5W 600 MG/50ML IV SOLN
600 | Freq: Three times a day (TID) | INTRAVENOUS | Status: DC
Start: 2023-06-08 — End: 2023-06-09
  Administered 2023-06-08 – 2023-06-09 (×2): 600 mg via INTRAVENOUS

## 2023-06-08 MED ORDER — SODIUM CHLORIDE (PF) 0.9 % IJ SOLN
0.9 | INTRAMUSCULAR | Status: DC | PRN
Start: 2023-06-08 — End: 2023-06-08

## 2023-06-08 MED ORDER — ONDANSETRON HCL 4 MG/2ML IJ SOLN
4 MG/2ML | INTRAMUSCULAR | Status: DC | PRN
  Administered 2023-06-08: 14:00:00 4 via INTRAVENOUS

## 2023-06-08 MED ORDER — PROPOFOL 200 MG/20ML IV EMUL
200 MG/20ML | INTRAVENOUS | Status: DC | PRN
  Administered 2023-06-08: 14:00:00 50 via INTRAVENOUS
  Administered 2023-06-08: 14:00:00 150 via INTRAVENOUS

## 2023-06-08 MED ORDER — ONDANSETRON 4 MG PO TBDP
4 | Freq: Three times a day (TID) | ORAL | Status: DC | PRN
Start: 2023-06-08 — End: 2023-06-09

## 2023-06-08 MED ORDER — SUGAMMADEX SODIUM 200 MG/2ML IV SOLN
200 MG/2ML | INTRAVENOUS | Status: DC | PRN
  Administered 2023-06-08: 16:00:00 200 via INTRAVENOUS

## 2023-06-08 MED ORDER — HYDROMORPHONE HCL 1 MG/ML IJ SOLN
1 | INTRAMUSCULAR | Status: DC | PRN
Start: 2023-06-08 — End: 2023-06-09

## 2023-06-08 MED ORDER — OXYCODONE-ACETAMINOPHEN 5-325 MG PO TABS
5-325 | ORAL | Status: DC | PRN
Start: 2023-06-08 — End: 2023-06-08

## 2023-06-08 MED ORDER — OXYCODONE-ACETAMINOPHEN 5-325 MG PO TABS
5-325 MG | ORAL_TABLET | ORAL | 0 refills | Status: AC | PRN
Start: 2023-06-08 — End: 2023-06-15

## 2023-06-08 MED ORDER — OXYCODONE HCL 5 MG PO TABS
5 | ORAL | Status: DC | PRN
Start: 2023-06-08 — End: 2023-06-08

## 2023-06-08 MED ORDER — LACTATED RINGERS IV SOLN
INTRAVENOUS | Status: DC
Start: 2023-06-08 — End: 2023-06-09
  Administered 2023-06-08: 12:00:00 via INTRAVENOUS

## 2023-06-08 MED ORDER — KETOROLAC TROMETHAMINE 15 MG/ML IJ SOLN
15 | Freq: Four times a day (QID) | INTRAMUSCULAR | Status: DC | PRN
Start: 2023-06-08 — End: 2023-06-09
  Administered 2023-06-08: 18:00:00 30 mg via INTRAVENOUS

## 2023-06-08 MED ORDER — ESMOLOL HCL 100 MG/10ML IV SOLN
100 MG/10ML | INTRAVENOUS | Status: DC | PRN
  Administered 2023-06-08 (×2): 20 via INTRAVENOUS

## 2023-06-08 MED ORDER — PHENYLEPHRINE HCL 10 MG/ML SOLN (MIXTURES ONLY)
10 MG/ML | Status: DC | PRN
  Administered 2023-06-08: 15:00:00 100 via INTRAVENOUS

## 2023-06-08 MED ORDER — ONDANSETRON HCL 4 MG/2ML IJ SOLN
4 | Freq: Four times a day (QID) | INTRAMUSCULAR | Status: DC | PRN
Start: 2023-06-08 — End: 2023-06-09

## 2023-06-08 MED ORDER — METHOCARBAMOL 750 MG PO TABS
750 | Freq: Three times a day (TID) | ORAL | Status: DC | PRN
Start: 2023-06-08 — End: 2023-06-09
  Administered 2023-06-09: 04:00:00 750 mg via ORAL

## 2023-06-08 MED ORDER — HYDROMORPHONE HCL 1 MG/ML IJ SOLN
1 MG/ML | INTRAMUSCULAR | Status: DC | PRN
  Administered 2023-06-08: 16:00:00 .75 via INTRAVENOUS
  Administered 2023-06-08: 15:00:00 .25 via INTRAVENOUS

## 2023-06-08 MED ORDER — LIDOCAINE HCL 2 % IJ SOLN
2 % | INTRAMUSCULAR | Status: DC | PRN
  Administered 2023-06-08: 14:00:00 100 via INTRAVENOUS

## 2023-06-08 MED ORDER — VANCOMYCIN HCL 1 G IV SOLR
1 | INTRAVENOUS | Status: AC
Start: 2023-06-08 — End: ?

## 2023-06-08 MED ORDER — ACETAMINOPHEN 325 MG PO TABS
325 | Freq: Four times a day (QID) | ORAL | Status: DC
Start: 2023-06-08 — End: 2023-06-09
  Administered 2023-06-08 – 2023-06-09 (×5): 650 mg via ORAL

## 2023-06-08 MED ORDER — NORMAL SALINE FLUSH 0.9 % IV SOLN
0.9 | Freq: Two times a day (BID) | INTRAVENOUS | Status: DC
Start: 2023-06-08 — End: 2023-06-08

## 2023-06-08 MED ORDER — FENTANYL CITRATE (PF) 100 MCG/2ML IJ SOLN
100 MCG/2ML | INTRAMUSCULAR | Status: DC | PRN
  Administered 2023-06-08 (×2): 50 via INTRAVENOUS

## 2023-06-08 MED ORDER — NORMAL SALINE FLUSH 0.9 % IV SOLN
0.9 | INTRAVENOUS | Status: DC | PRN
Start: 2023-06-08 — End: 2023-06-09

## 2023-06-08 MED ORDER — CEFAZOLIN SODIUM-DEXTROSE 2-4 GM/100ML-% IV SOLN
2-4 | INTRAVENOUS | Status: DC
Start: 2023-06-08 — End: 2023-06-08

## 2023-06-08 MED ORDER — LIDOCAINE HCL (PF) 1 % IJ SOLN
1 | Freq: Once | INTRAMUSCULAR | Status: DC | PRN
Start: 2023-06-08 — End: 2023-06-08

## 2023-06-08 MED ORDER — KETAMINE HCL 50 MG/ML IV SOSY
50 | INTRAVENOUS | Status: AC
Start: 2023-06-08 — End: ?

## 2023-06-08 MED ORDER — VANCOMYCIN HCL 1 G IV SOLR
1 | INTRAVENOUS | Status: DC | PRN
Start: 2023-06-08 — End: 2023-06-08
  Administered 2023-06-08: 15:00:00 1000 via TOPICAL

## 2023-06-08 MED ORDER — TRAMADOL HCL 50 MG PO TABS
50 | Freq: Four times a day (QID) | ORAL | Status: DC | PRN
Start: 2023-06-08 — End: 2023-06-09

## 2023-06-08 MED ORDER — CYCLOBENZAPRINE HCL 10 MG PO TABS
10 | Freq: Two times a day (BID) | ORAL | Status: DC | PRN
Start: 2023-06-08 — End: 2023-06-09
  Administered 2023-06-09: 13:00:00 10 mg via ORAL

## 2023-06-08 MED ORDER — MIDAZOLAM HCL 2 MG/2ML IJ SOLN
2 | INTRAMUSCULAR | Status: DC | PRN
Start: 2023-06-08 — End: 2023-06-08
  Administered 2023-06-08: 14:00:00 2 via INTRAVENOUS

## 2023-06-08 MED ORDER — SODIUM CHLORIDE 0.9 % IV SOLN
0.9 | INTRAVENOUS | Status: DC | PRN
Start: 2023-06-08 — End: 2023-06-09

## 2023-06-08 MED ORDER — GLYCOPYRROLATE 0.2 MG/ML IJ SOLN
0.2 | INTRAMUSCULAR | Status: DC | PRN
Start: 2023-06-08 — End: 2023-06-08
  Administered 2023-06-08: 14:00:00 .2 via INTRAVENOUS

## 2023-06-08 MED ORDER — TRAMADOL HCL 50 MG PO TABS
50 | Freq: Four times a day (QID) | ORAL | Status: DC | PRN
Start: 2023-06-08 — End: 2023-06-09
  Administered 2023-06-08 – 2023-06-09 (×4): 100 mg via ORAL

## 2023-06-08 MED ORDER — DEXAMETHASONE SODIUM PHOSPHATE 4 MG/ML IJ SOLN
4 MG/ML | INTRAMUSCULAR | Status: DC | PRN
  Administered 2023-06-08: 14:00:00 10 via INTRAVENOUS

## 2023-06-08 MED ORDER — METHOCARBAMOL 1000 MG/10ML IJ SOLN
100010 MG/10ML | Freq: Three times a day (TID) | INTRAMUSCULAR | Status: DC | PRN
Start: 2023-06-08 — End: 2023-06-09
  Administered 2023-06-08: 16:00:00 1000 mg via INTRAVENOUS

## 2023-06-08 MED ORDER — NALOXONE HCL 0.4 MG/ML IJ SOLN
0.4 MG/ML | INTRAMUSCULAR | Status: DC | PRN
Start: 2023-06-08 — End: 2023-06-08

## 2023-06-08 MED ORDER — FENTANYL CITRATE (PF) 100 MCG/2ML IJ SOLN
100 | INTRAMUSCULAR | Status: AC
Start: 2023-06-08 — End: ?

## 2023-06-08 MED ORDER — NORMAL SALINE FLUSH 0.9 % IV SOLN
0.9 | Freq: Two times a day (BID) | INTRAVENOUS | Status: DC
Start: 2023-06-08 — End: 2023-06-09
  Administered 2023-06-09 (×2): 10 mL via INTRAVENOUS

## 2023-06-08 MED ORDER — LIDOCAINE HCL (PF) 1 % IJ SOLN
1 | Freq: Once | INTRAMUSCULAR | Status: AC
Start: 2023-06-08 — End: 2023-06-08
  Administered 2023-06-08: 12:00:00 2 mL via INTRADERMAL

## 2023-06-08 MED ORDER — SODIUM CHLORIDE 0.9 % IV SOLN
0.9 | INTRAVENOUS | Status: DC
Start: 2023-06-08 — End: 2023-06-09
  Administered 2023-06-08: 17:00:00 via INTRAVENOUS

## 2023-06-08 MED ORDER — KETAMINE HCL 50 MG/ML IV SOSY
50 MG/ML | INTRAVENOUS | Status: DC | PRN
  Administered 2023-06-08: 14:00:00 25 via INTRAVENOUS

## 2023-06-08 MED ORDER — CEFAZOLIN SODIUM-DEXTROSE 2-4 GM/100ML-% IV SOLN
2-4 | Freq: Three times a day (TID) | INTRAVENOUS | Status: AC
Start: 2023-06-08 — End: 2023-06-08
  Administered 2023-06-08 – 2023-06-09 (×2): 2000 mg via INTRAVENOUS

## 2023-06-08 MED ORDER — MIDAZOLAM HCL 2 MG/2ML IJ SOLN
2 | INTRAMUSCULAR | Status: AC
Start: 2023-06-08 — End: ?

## 2023-06-08 MED FILL — FENTANYL CITRATE (PF) 100 MCG/2ML IJ SOLN: 100 MCG/2ML | INTRAMUSCULAR | Qty: 2

## 2023-06-08 MED FILL — ACETAMINOPHEN 325 MG PO TABS: 325 MG | ORAL | Qty: 2

## 2023-06-08 MED FILL — THROMBIN-JMI 5000 UNITS EX SOLR: 5000 units | CUTANEOUS | Qty: 5000

## 2023-06-08 MED FILL — HYDROMORPHONE HCL 1 MG/ML IJ SOLN: 1 MG/ML | INTRAMUSCULAR | Qty: 1

## 2023-06-08 MED FILL — VANCOMYCIN 1500 MG NS 500 ML (PREMIX) IVPB: Qty: 500

## 2023-06-08 MED FILL — LACTATED RINGERS IV SOLN: INTRAVENOUS | Qty: 1000

## 2023-06-08 MED FILL — SODIUM CHLORIDE 0.9 % IV SOLN: 0.9 % | INTRAVENOUS | Qty: 1000

## 2023-06-08 MED FILL — TRAMADOL HCL 50 MG PO TABS: 50 MG | ORAL | Qty: 2

## 2023-06-08 MED FILL — KETOROLAC TROMETHAMINE 15 MG/ML IJ SOLN: 15 MG/ML | INTRAMUSCULAR | Qty: 2

## 2023-06-08 MED FILL — CEFAZOLIN SODIUM-DEXTROSE 2-4 GM/100ML-% IV SOLN: 2-4 GM/100ML-% | INTRAVENOUS | Qty: 100

## 2023-06-08 MED FILL — KETAMINE HCL 50 MG/ML IV SOSY: 50 MG/ML | INTRAVENOUS | Qty: 1

## 2023-06-08 MED FILL — CLINDAMYCIN PHOSPHATE IN D5W 600 MG/50ML IV SOLN: 600 MG/50ML | INTRAVENOUS | Qty: 50

## 2023-06-08 MED FILL — VANCOMYCIN HCL 1 G IV SOLR: 1 g | INTRAVENOUS | Qty: 1000

## 2023-06-08 MED FILL — METHOCARBAMOL 1000 MG/10ML IJ SOLN: 1000 MG/10ML | INTRAMUSCULAR | Qty: 10

## 2023-06-08 MED FILL — MIDAZOLAM HCL 2 MG/2ML IJ SOLN: 2 MG/ML | INTRAMUSCULAR | Qty: 2

## 2023-06-08 NOTE — Anesthesia Post-Procedure Evaluation (Signed)
Department of Anesthesiology  Postprocedure Note    Patient: Jillian Harvey  MRN: [id number]  Birthdate: [birth year]  Date of evaluation: 06/17/2023    Procedure Summary       Date: 06/08/23 Room / Location: [facility] MAIN 10 / [facility] MAIN OR    Anesthesia Start: 0958 Anesthesia Stop: 1151    Procedure: LUMBAR DECOMPRESSION L2-4 (Spine Lumbar) Diagnosis:       Spinal stenosis, lumbar region, without neurogenic claudication      (Spinal stenosis, lumbar region, without neurogenic claudication [M48.061])    Surgeons: Arita Miss, MD Responsible Provider: Earlie Server, MD    Anesthesia Type: General ASA Status: 2            Anesthesia Type: General    Aldrete Phase I: Aldrete Score: 10    Aldrete Phase II:      Anesthesia Post Evaluation    Patient location during evaluation: bedside  Patient participation: complete - patient participated  Level of consciousness: awake  Airway patency: patent  Nausea & Vomiting: no nausea and no vomiting  Cardiovascular status: blood pressure returned to baseline  Respiratory status: acceptable  Hydration status: stable  Pain management: adequate        No notable events documented.

## 2023-06-08 NOTE — Op Note (Signed)
Operative Note      Patient: Jillian Harvey  Date of Birth: [birth year]  MRN: [id number]      [address]  [place], [place] [address]  Office: [phone]      [facility]  Operation Report  Patient: Jillian Harvey               Sex: female          DOA: 06/08/2023  Date of Birth:  1976/01/01      Age:  47 y.o.        LOS:  LOS: 0 days   MRN: [id number]                    CSN: [id number]  Room: OR/PL         PREOPERATIVE DIAGNOSES:  Severe Sciatica.  HNP Lumbar.  Spinal Stenosis.  Failure of conservative management    POSTOPERATIVE DIAGNOSES:  same    PROCEDURE PERFORMED:  Lumbar laminotomy/laminectomy, with decompression of lumbar canal and affected neural foramina, with partial discectomy.    Operative level(s):  L2-L4 (two levels)    SURGEON:  Lyna Poser, M.D.     ASSISTANT:  Fredderick Phenix, PA-C  (Exposure, retraction, hemostasis, wound closure, etc. Assistant surgeon was present and actively participated in the surgery for its entirety.  No intern, resident, or house staff was available to assist.  The complexity of this surgery required the participation of the Designer, television/film set, throughout.).     ANESTHESIA:  General Endotracheal     BLOOD LOSS:  300cc     FLUIDS RECEIVED:  Crystalloid     DRAINS:  One hemovac     COMPLICATIONS:  None.    INTRAOPERATIVE FINDINGS:  Stenosis.     HISTORY:  Patient presented to me, with debilitating axial back pain and sciatica.  Extensive attempts at nonsurgical management failed to alleviate symptoms. Risk and benefits of surgical management were discussed in detail.  Patient agreed to proceed.    DESCRIPTION OF PROCEDURE:  On the day of surgery, the patient was admitted through the surgical admitting unit.  Appropriate IV antibiotics were administered.  Patient was escorted to the operating room.  A general anesthetic was performed by the anesthesiologist.  The patient was placed prone on the operating room table.  The [place] frame was used.  All bony  prominences were well-padded.  Patient's back was prepped in the usual fashion.  Drapes were placed.    A midline incision was made at the appropriate surgical level(s).  Soft tissues were dissected down to the deep fascia.  Cerebellar retractors were used.  Posterior spinal musculature was split midline and retracted.  At the appropriate operative level(s), a full central, lateral recess, and foraminal decompression was performed.  Any arthritic facet tissue, aberrant disc bulge, etc., was excised in the usual fashion.  The decompression was performed bilaterally, at the appropriate surgical level(s)  Subsequent to the decompression, no further neurologic compression was noted, from the pedicles above the surgical level, down to and past the pedicles below the surgical level.  No dural tears were encountered.  Decompression was deemed adequate via direct visualization, etc.    The lumbar wound was then copiously irrigated, with the pulse lavage system, etc.  Surgiflow was used for hemostasis.  Vancomycin powder was used throughout.  Wounds were then closed in layers.  Vicryl was used for skin.  Sterile dressings were applied.  Patient was subsequently extubated and taken to the  recovery room in stable condition without complications.  All counts were correct.    Arita Miss, MD  06/08/23      Implants:  Implant Name Type Inv. Item Serial No. Manufacturer Lot No. LRB No. Used Action   POWDER WOUND XCELLISTEM 1000MG  - [id number] Collagen POWDER WOUND XCELLISTEM 1000MG   [id number] N/A 1 Implanted         Drains:   Closed/Suction Drain Inferior;Left Back Accordion (Active)           Electronically signed by Arita Miss, MD on 06/08/2023 at 11:30 AM

## 2023-06-08 NOTE — Other (Signed)
TRANSFER - OUT REPORT:    Verbal report given to Hailey RN on Jillian Harvey  being transferred to 2124 for routine progression of patient care       Report consisted of patient's Situation, Background, Assessment and   Recommendations(SBAR).     Information from the following report(s) Nurse Handoff Report was reviewed with the receiving nurse.    Opportunity for questions and clarification was provided.      Patient transported with:   O2 @ 2 liters  Tech    Lines:  Help Text      This SmartLink retrieves the last documented value for LDA assessment data, retrieved by either LDA type or by LDA group ID.     This SmartLink should be used in a Chief Technology Officer. If one is not available, please Designer, multimedia.

## 2023-06-08 NOTE — Progress Notes (Signed)
Call placed to answering service regarding pts home nightly medications.  Orders received from [facility], MD to order pts nightly trazodone and lyrica.

## 2023-06-08 NOTE — Plan of Care (Signed)
Problem: Pain  Goal: Verbalizes/displays adequate comfort level or baseline comfort level  Outcome: Progressing     Problem: ABCDS Injury Assessment  Goal: Absence of physical injury  Outcome: Progressing     Problem: Discharge Planning  Goal: Discharge to home or other facility with appropriate resources  Outcome: Progressing

## 2023-06-08 NOTE — Progress Notes (Signed)
RIDE/SON  DANIEL COLON-ESTRADA=[phone]

## 2023-06-08 NOTE — Anesthesia Pre-Procedure Evaluation (Addendum)
Department of Anesthesiology  Preprocedure Note       Name:  Jillian Harvey   Age:  47 y.o.  DOB:  1976-11-21                                          MRN:  [id number]         Date:  06/08/2023      Surgeon: Moishe Spice):  Arita Miss, MD    Procedure: Procedure(s):  LUMBAR DECOMPRESSION L2-4    Medications prior to admission:   Prior to Admission medications    Medication Sig Start Date End Date Taking? Authorizing Provider   benzonatate (TESSALON) 100 MG capsule Take 1 capsule by mouth 2 times daily as needed 11/02/21  Yes [provider]   escitalopram (LEXAPRO) 10 MG tablet Take 3 tablets by mouth daily 04/06/19  Yes [provider]   Ferrous Sulfate (IRON PO)  03/07/21  Yes [provider]   traZODone (DESYREL) 50 MG tablet 1 tablet 04/06/19  Yes [provider]   traMADol (ULTRAM) 50 MG tablet Take 1 tablet by mouth every 8 hours as needed for Pain.    [provider]   acetaminophen (TYLENOL) 500 MG tablet Take 2 tablets by mouth every 8 hours as needed for Pain    [provider]   HYDROmorphone (DILAUDID) 2 MG tablet Take 1 tablet by mouth every 4 hours as needed for Pain. 1-2 tablets    [provider]   albuterol sulfate HFA (PROVENTIL;VENTOLIN;PROAIR) 108 (90 Base) MCG/ACT inhaler Inhale 2 puffs into the lungs every 4 hours as needed for Shortness of Breath    [provider]   diphenhydrAMINE HCl (BENADRYL PO) Take 25 mg by mouth in the morning and 25 mg in the evening. take with famotidine to help prevent rash on face      [provider]   cyclobenzaprine (FLEXERIL) 10 MG tablet Take 1 tablet by mouth 3 times daily    [provider]   DULoxetine (CYMBALTA) 30 MG extended release capsule Take 1 capsule by mouth daily    [provider]   famotidine (PEPCID) 20 MG tablet Take 1 tablet by mouth 2 times daily    [provider]   hydrOXYzine HCl (ATARAX) 25 MG tablet Take 1 tablet by mouth every 8  hours as needed for Anxiety    [provider]   omeprazole (PRILOSEC) 40 MG delayed release capsule Take 1 capsule by mouth daily    [provider]   pregabalin (LYRICA) 75 MG capsule Take 1 capsule by mouth 2 times daily.    [provider]   PROBIOTIC, LACTOBACILLUS, PO Take 1 tablet by mouth daily.    [provider]       Current medications:    Current Facility-Administered Medications   Medication Dose Route Frequency Provider Last Rate Last Admin   . ceFAZolin (ANCEF) 2000 mg in dextrose 4 % 100 mL IVPB (premix)  2,000 mg IntraVENous On Call to OR Arita Miss, MD       . vancomycin (VANCOCIN) 1500 mg in sodium chloride 0.9% 500 mL IVPB  1,500 mg IntraVENous On Call to OR Arita Miss, MD       . lactated ringers IV soln infusion   IntraVENous Continuous Arita Miss, MD 25 mL/hr at 06/08/23 [phone]  New Bag at 06/08/23 0749   . lidocaine PF 1 % injection 1 mL  1 mL IntraDERmal Once PRN Shirelle Tootle, Eliezer Champagne, MD       . lactated ringers IV soln infusion   IntraVENous Continuous Nataya Bastedo, Eliezer Champagne, MD           Allergies:    Allergies   Allergen Reactions   . Hydrocodone Anaphylaxis   . Penicillins Anaphylaxis   . Oxycodone-Acetaminophen Itching       Problem List:    Patient Active Problem List   Diagnosis Code   . Menorrhagia N92.0   . Spinal stenosis M48.00       Past Medical History:        Diagnosis Date   . Asthma        Past Surgical History:        Procedure Laterality Date   . BACK SURGERY     . CERVICAL FUSION     . CESAREAN SECTION     . HYSTERECTOMY (CERVIX STATUS UNKNOWN)     . JOINT REPLACEMENT     . SPINAL CORD STIMULATOR IMPLANT     . TUBAL LIGATION         Social History:    Social History     Tobacco Use   . Smoking status: Never   . Smokeless tobacco: Never   Substance Use Topics   . Alcohol use: Yes     Comment: social                                Counseling given: Not Answered      Vital Signs (Current):   Vitals:    06/04/23 1012 06/08/23 0711   BP:   135/83   Pulse:  88   Resp:  18   Temp:  97.1 F (36.2 C)   TempSrc:  Oral   SpO2:  100%   Weight: 88 kg (194 lb) 87.6 kg (193 lb 2 oz)   Height: 1.626 m (5\' 4" ) 1.626 m (5\' 4" )                                              BP Readings from Last 3 Encounters:   06/08/23 135/83   04/19/23 128/84   04/16/23 124/76       NPO Status: Time of last liquid consumption: 0545                        Time of last solid consumption: 2000                        Date of last liquid consumption: 06/08/23                        Date of last solid food consumption: 06/07/23    BMI:   Wt Readings from Last 3 Encounters:   06/08/23 87.6 kg (193 lb 2 oz)     Body mass index is 33.15 kg/m.    CBC:   Lab Results   Component Value Date/Time    WBC 7.0 06/04/2023 12:49 PM    RBC 4.65 06/04/2023 12:49 PM    HGB 14.5 06/04/2023 12:49 PM    HCT  44.0 06/04/2023 12:49 PM    MCV 94.6 06/04/2023 12:49 PM    RDW 46.8 06/04/2023 12:49 PM    PLT 349 06/04/2023 12:49 PM       CMP:   Lab Results   Component Value Date/Time    NA 139 06/04/2023 12:49 PM    K 3.8 06/04/2023 12:49 PM    CL 105 06/04/2023 12:49 PM    CO2 26 06/04/2023 12:49 PM    BUN 10 06/04/2023 12:49 PM    CREATININE 0.84 06/04/2023 12:49 PM    GFRAA >60.0 06/04/2023 12:49 PM    LABGLOM >60 06/04/2023 12:49 PM    GLUCOSE 91 06/04/2023 12:49 PM    CALCIUM 9.4 06/04/2023 12:49 PM       POC Tests: No results for input(s): "POCGLU", "POCNA", "POCK", "POCCL", "POCBUN", "POCHEMO", "POCHCT" in the last 72 hours.    Coags:   Lab Results   Component Value Date/Time    PROTIME 11.4 06/04/2023 12:49 PM    INR 1.0 06/04/2023 12:49 PM    APTT 35.6 06/04/2023 12:49 PM       HCG (If Applicable):   Lab Results   Component Value Date    HCGQUANT <1 03/24/2018        ABGs: No results found for: "PHART", "PO2ART", "PCO2ART", "HCO3ART", "BEART", "O2SATART"     Type & Screen (If Applicable):  No results found for: "LABABO"    Drug/Infectious Status (If Applicable):  No results found for: "HIV",  "HEPCAB"    COVID-19 Screening (If Applicable): No results found for: "COVID19"        Anesthesia Evaluation     no history of anesthetic complications:   Airway: Mallampati: II  TM distance: >3 FB   Neck ROM: full  Mouth opening: > = 3 FB   Dental: normal exam         Pulmonary:normal exam    (+)           asthma:     (-) COPD, sleep apnea and not a current smoker                           Cardiovascular:  Exercise tolerance: good (>4 METS)      (-) hypertension, past MI, CAD, CABG/stent, dysrhythmias and no hyperlipidemia                Neuro/Psych:      (-) seizures, TIA and CVA           GI/Hepatic/Renal:   (+) GERD: well controlled     (-) liver disease and no renal disease       Endo/Other:    (+) no malignancy/cancer.    (-) diabetes mellitus, hypothyroidism, hyperthyroidism, no malignancy/cancer               Abdominal:             Vascular: negative vascular ROS.         Other Findings:       Anesthesia Plan      general     ASA 2       Induction: intravenous.    MIPS: Postoperative opioids intended.  Anesthetic plan and risks discussed with patient.      Plan discussed with CRNA.    Attending anesthesiologist reviewed and agrees with Preprocedure content            Jorge Mandril, MD   06/08/2023

## 2023-06-09 MED ORDER — TRAMADOL HCL 50 MG PO TABS
50 MG | ORAL_TABLET | Freq: Four times a day (QID) | ORAL | 0 refills | Status: AC | PRN
Start: 2023-06-09 — End: 2023-06-16

## 2023-06-09 MED ORDER — PREGABALIN 75 MG PO CAPS
75 | Freq: Two times a day (BID) | ORAL | Status: DC
Start: 2023-06-09 — End: 2023-06-09
  Administered 2023-06-09 (×2): 150 mg via ORAL

## 2023-06-09 MED ORDER — TRAZODONE HCL 50 MG PO TABS
50 | Freq: Every evening | ORAL | Status: DC
Start: 2023-06-09 — End: 2023-06-09
  Administered 2023-06-09: 03:00:00 50 mg via ORAL

## 2023-06-09 MED FILL — SODIUM CHLORIDE FLUSH 0.9 % IV SOLN: 0.9 % | INTRAVENOUS | Qty: 10

## 2023-06-09 MED FILL — TRAMADOL HCL 50 MG PO TABS: 50 MG | ORAL | Qty: 2

## 2023-06-09 MED FILL — ACETAMINOPHEN 325 MG PO TABS: 325 MG | ORAL | Qty: 2

## 2023-06-09 MED FILL — TRAZODONE HCL 50 MG PO TABS: 50 MG | ORAL | Qty: 1

## 2023-06-09 MED FILL — CLINDAMYCIN PHOSPHATE IN D5W 600 MG/50ML IV SOLN: 600 MG/50ML | INTRAVENOUS | Qty: 50

## 2023-06-09 MED FILL — METHOCARBAMOL 1000 MG/10ML IJ SOLN: 1000 MG/10ML | INTRAMUSCULAR | Qty: 10

## 2023-06-09 MED FILL — PREGABALIN 75 MG PO CAPS: 75 MG | ORAL | Qty: 2

## 2023-06-09 MED FILL — METHOCARBAMOL 750 MG PO TABS: 750 MG | ORAL | Qty: 1

## 2023-06-09 MED FILL — CYCLOBENZAPRINE HCL 10 MG PO TABS: 10 MG | ORAL | Qty: 1

## 2023-06-09 MED FILL — CEFAZOLIN SODIUM-DEXTROSE 2-4 GM/100ML-% IV SOLN: 2-4 GM/100ML-% | INTRAVENOUS | Qty: 100

## 2023-06-09 NOTE — Progress Notes (Signed)
OCCUPATIONAL THERAPY EVALUATION   Acknowledge Orders  Time  OT Charge Capture  Rehab Caseload Tracker    [employer] AM-PAC "6 Clicks" Daily Activity Inpatient Short Form  -  Patient: Jillian Harvey (47 y.o. 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]  Spinal stenosis [M48.00]   Procedure(s) (LRB):  LUMBAR DECOMPRESSION L2-4 (N/A) 1 Day Post-Op  Date of Admission: 06/08/2023   Length of Stay:  0 day(s)  Insurance: Payor: BCBS / Plan: BCBS OUT OF STATE / Product Type: *No Product type* /      Date: 06/09/2023  Time In: 1028        Time Out: 1055       Total Minutes: 27   Treatment time: 15 minutes    Isolation:  No active isolations       MDRO: No active infections    Precautions: falls, spinal:  no bending, no lifting, no twisting  " BLT", LSO brace   Ordered weight bearing status: no weight bearing restrictions    Current diet order: ADULT DIET; Regular    Orders, labs, and chart reviewed on Shakiyla L Colon-Estrada. Communicated with nursing staff. Patient cleared to participate in Occupational Therapy.    ASSESSMENT   Based on the objective data described below, the patient presents with     -  patient is able to perform basic self care tasks without assistance.  -  patient is modified independent for all functional mobility tasks  .    -  patient has a supportive son, daughter at home to assist them as needed.  -  spine precautions reviewed.  patient verbalized understanding..    -  after patient/caregiver education/treatment, no further skilled Occupational Therapy needed during acute stay .  OT to d/c from caseload.  -  patient is safe to d/c home with family support.    No further skilled OT indicated at this time. Will sign off. Please reconsult if patient condition changes.    Recommendations:  -  Recommend treatment after evaluation, then no further occupational therapy intervention during acute  stay.    Discharge Recommendations:   -  Home with family/caregiver support    FUNCTIONAL ASSESSMENT  AM-PAC Inpatient Daily Activity Raw Score: 24 (06/09/23 1203)    -  Home:   At this time and based on an AM-PAC score, no further OT is recommended upon discharge due to (i.e. patient at baseline functional status).  Recommend patient returns to prior setting with prior services.    This AMPAC score should be considered in conjunction with interdisciplinary team recommendations to determine the most appropriate discharge setting. Patient's social support, diagnosis, medical stability, and prior level of function should also be taken into consideration.    Equipment Recommendations for Discharge:  -  no needs anticipated; has DME available at home     PRIOR LEVEL OF FUNCTION     Information was obtained by: patient  Home environment: Patient lives alone in a 1 story home. Has adult children that can help.  Prior level of function: Patient is independent with basic ADLs and ambulation. Pt with recent THA in April, just discharged from [facility] and off cane.   Prior level of Instrumental Activities of Daily Living: Patient reports they do the best they can in IADLs.   Patient does have family that assists them.   Home equipment: Rolling walker, cane      EDUCATION/COMMUNICATION  Barriers to learning/limitations: None    Education provided UJ:[id number] on (+) role of OT, (+) OT plan of care, (+) Instructed patient in the benefits of maintaining activity tolerance, functional mobility, and independence with self care tasks during acute stay  to ensure safe return home and to baseline. Encouraged patient to increase frequency and duration OOB, be out of bed for all meals, perform daily ADLs (as approved by RN/MD regarding bathing etc), and performing functional mobility to/from bathroom with staff assistance as needed., (+) spinal precautions (no "BLT": no bending, no lifting, no twisting) and their impact on functional tasks,  (+) logroll technique and positioning, (+)  bathroom safety, home safety.  Reviewed: picture of safe bathroom & sitting area, adaptive equipment use, (+) staff assistance with mobility, (+) change positions frequently, (+) functional mobility, (+) discharge disposition/recommendations, (+) use of ice to assist with pain management    Educational handouts issued: OT and Spine, home safety booklet: bathroom safety, home safety, picture of safe bathroom & sitting area, reacher and sock aid use.     Patient / family response to education: verbalized understanding    SUBJECTIVE     Patient "This is my 4th back surgery, I got this"    Pain Assessment: well managed    OBJECTIVE DATA SUMMARY     Patient was admitted to the hospital on 06/08/2023 with No chief complaint on file.    Present illness history:   Patient Active Problem List    Diagnosis Date Noted    Spinal stenosis 06/08/2023    Menorrhagia 03/28/2018      Previous medical history:   Past Medical History:   Diagnosis Date    Asthma        PATIENT FOUND     Bedside chair    Treatment included: therapeutic activity, functional mobility retraining, ADI retraining, activity tolerance, education for positioning and/or educational instruction during/immediately following OT evaluation    COGNITIVE STATUS     Mental status:  Patient is awake, alert, pleasant and cooperative.  Patient is oriented to person, place, month and year.  Patient demonstrates appropriate eye contact, appropriate casual conversation and is attentive.  Communication: normal, speech and language intact  Attention Span:  normal  Follows commands: follows multi-step complex commands/direction  Safety/Judgement:  appropriate awareness of environment and need for assistance  Hearing:   grossly intact  Vision:   grossly intact    UPPER EXTREMITY ASSESSMENT     Dominance:right  RIGHT:  ACTIVE range of motion is [facility] Strength is grossly graded as  WFL's   LEFT:   ACTIVE range of motion is [facility]  Strength is  grossly graded as WFL's           ACTIVITIES OF DAILY LIVING     Based on direct observation, simulation and clinical assessment.  (clincial judgement based on: activity tolerance, balance, safety awareness, cognition, functional strength/ROM/coordination of all extremities)    Eating:           - independent  Grooming:     - independent  UB bathing:   - independent  LB bathing:   -  independent  UB dressing: - independent  LB dressing: - independent  Toileting:       - independent    Comment(s):   -  Instructed patient  in the following:    1. Home safety - (I.E remove throw rugs, appropriate height for sitting, remove clutter/clear pathways,recliner safety, change of floor surfaces)   2. Bathroom  safety - (I.E. Grab bars, DME options, no slip rugs)  3. AE options if needed.  to increase independence and fall prevention during standing level ADLs and mobility.   -   Patient instructed to don all clothing while seated then stand for over hips. Doff all clothing to knees in standing, then sit to doff clothing from knees to feet in order to facilitate fall prevention, pain management, and energy conservation during lower body ADLs.  Patient was instructed in adaptive equipment options for ADL's, reacher and sock aid, as needed.  -  Instructed patient in the following:  Spine precautions and impact on ADLs.  Cross leg technique for lower body dressing.  Adaptive equipment options for ADL's, reacher and sock aid, as needed.  -  Tub/shower transfer: Patient instructed to use the same technique as with stairs when entering and exiting tub (up with the "stronger" leg, down with the "weaker" leg).   Patient verbalized understanding.   - -  Pt with recent THA , however off precautions and able to don sock on affected leg I/y without breaking spine precautions.     FUNCTIONAL MOBILITY STATUS     Mobility:  -  Sit to supine -  independent  -  Sit to stand -  independent  -  Functional ambulation in room: independent      Transfers:  -  Functional transfers: independent    Comment(s):   -  Instructed patient in proper hand placement technique during sit <->stand:  Push up from the hand rails or sitting surface of the chair/bedside commode/bed when standing.   Always reach back for the hand rails or sitting surface of the chair/bedside commode/bed when sitting.      -  Instructed patient in use of ice to assist with pain management.   -  Instructed patient in logrolling technique for bed mobility.  -  Discussed with patient the following 1. Do not drive while taking a narcotic pain medicine  2. Do not drive until cleared by your surgeon   3.  Do not be a passenger in a car except to go home from the hospital or to see a physician until cleared by surgeon    BALANCE AND ACTIVITY TOLERANCE         Activity Tolerance:   - good tolerance to activity during OT session    Comment(s):   -  Instructed patient in the following; 1. Encouraged patient to sit up in chair 45-60 minutes at a time.  2. Alternate between the bed, walking with staff and sitting in the chair to ease surgical pain. 3. The importance of activity while hospitalized to prevent a decline in function.  Recommend the patient get out of bed to chair 2-3 times a day, with assistance as needed.    FINAL LOCATION     Seated in bed side chair, all needs within reach. Patient agrees to call for assistance.  Patient set up with tray table.    Thank you for this referral.    Edman Circle, OTR/L  June 09, 2023

## 2023-06-09 NOTE — Progress Notes (Signed)
PHYSICAL THERAPY EVALUATION     Acknowledge Orders  Time  PT Charge Capture  Rehab Caseload Tracker  [employer] AM-PAC "6 Clicks" Basic Mobility Inpatient Short Form  -    Patient: Jillian Harvey (47 y.o. 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    Isolation:  No active isolations       MDRO: No active infections  Current diet order: ADULT DIET; Regular    Precautions: falls, spinal:  no bending, no lifting, no twisting  " BLT", LSO brace   Ordered weight bearing status: no weight bearing restriction     ASSESSMENT:      The patient presents with  - neuro intact  - pain well managed  - active participation in gait training   - good safety awareness  - excellent motivation to participate in PT to improve functional activity tolerance and return to prior level of function    Patient's rehabilitation potential for below stated goals: good    Recommendations:  Recommend ambulation in room/hallway as tolerated, with assistance as needed.  Discharge Recommendations:   Home with family support.  Further Equipment Recommendations for Discharge:   No DME needs     AM-PAC: AM-PAC Inpatient Mobility Raw Score : 18  -  Home:   At this time and based on an AM-PAC score, no further PT is recommended upon discharge due to (i.e. patient at baseline functional status).  Recommend patient returns to prior setting with prior  services.    This AMPAC score should be considered in conjunction with interdisciplinary team recommendations to determine the most appropriate discharge setting. Patient's social support, diagnosis, medical stability, and prior level of function should also be taken into consideration.    PRIOR LEVEL OF FUNCTION / HOME ENVIRONMENT:      Information was obtained by patient  Home environment: Patient lives alone in an apartment on the first floor. Patient plans to stay at her mother's house (1 level home). Patient reports good support from her mother and her son.   Prior level of function: Independent. Had recent THA and just got off her cane a few days ago. Was undergoing outpatient PT. Patient plans to resume outpatient PT in about a month.  Patient with leg length discrepancy since her THA, and wearing a lift in her shoe.   Home equipment: rolling walker, straight cane     PLAN:      Patient will benefit from skilled Physical Therapy intervention to address the above impairments to return to prior level of function. Patient will be seen PT Plan of Care: Daily.    Patient will achieve PT goals in 1 week.     PHYSICAL THERAPY GOALS:     - Patient will be independent with bed mobility in preparation for EOB activities.   - Patient will be independent with transfers in preparation for OOB activities and  ambulation.  - Patient will tolerate sitting up in chair for meals.  - Patient will ambulate independently for 150 feet to promote functional independence at home.   - Patient will state/observe spine and back brace  precautions.     PLANNED INTERVENTIONS:      Skilled Physical Therapy services will provide functional mobility training, therapeutic exercises, therapeutic activities, patient/caregiver education as indicated, and will modify and progress therapeutic interventions to reach the stated goals.     COMMUNICATION/EDUCATION:     Education:   benefits of activity, OOB to chair for meals and mobilize as  tolerated, call staff for assistance, safety, activity pacing, deep breathing and relaxation techniques, ankle pumps to promote improved circulation and prevent DVTs, ergonomics, spinal precautions (no "BLT": no bending, no lifting, no twisting) and their impact on functional tasks; logroll technique and positioning, brace management, DME, home safety: good lighting and remove throw rugs, clutter for safe mobility at home, disposition, role of PT, PT plan of care, all questions answered   Education provided to:    patient; opportunity for questions and clarification was provided.   Readiness to learn/comprehension:   verbalized understanding and successful return demonstration   Barriers to learning/limitations:   none     SUBJECTIVE:     Patient is very motivated to participate in PT and increase activity.  Patient denies "pain", just c/o "some discomfort".  Pain not rated.    OBJECTIVE DATA SUMMARY:      Orders, labs, and chart reviewed on [facility]. Communicated with patient's nurse.     Present illness history:   Patient Active Problem List    Diagnosis Date Noted    Spinal stenosis 06/08/2023    Menorrhagia 03/28/2018      Previous medical history:   Past Medical History:   Diagnosis Date    Asthma       PATIENT FOUND:     Semi reclined in bed. (+) nurse present. PT adjusted patient's brace for proper fit and comfort and educated patient on management of brace. Patient fully dressed; independently donning crocks while sitting at EOB.     COGNITIVE STATUS:     Mental status/general cognition: normal mood, behavior, and thought processes, pleasant and cooperative     Communication: normal   Follows commands: intact   Safety/Judgement: appropriate awareness of environment and need for assistance as well as awareness of precautions     EXTREMITIES ASSESSMENT:      Strength:  Strength is grossly graded as within functional limits    L(0-5) R (0-5)    Hip Flexion (L1,2) 3/5  3/5     Knee Extension (L3,4)  5/5  5/5     Ankle Dorsiflexion (L4) 5/5  5/5     Great Toe Extension (L5) 5/5  5/5    Ankle Plantarflexion (S1) WNL WNL    Knee Flexion (S1,2) 5/5  5/5       Range of Motion: all extremities grossly WFL   Sensation: intact to light touch     THERAPEUTIC ACTIVITIES; FUNCTIONAL MOBILITY AND BALANCE STATUS:      Bed mobility:  Bed flat to simulate home situation: standby assist, verbal cues for logroll technique and positioning; patient demonstrating proper and safe technique     Transfers:  Sit - stand: supervision   Stand - sit: supervision    reviewed technique, patient demonstrating proper and safe technique     Functional Balance Sitting: Good static: patient able to maintain balance without  handhold support, limited postural sway   Functional Balance Standing:  fair (+): maintains balance with independence without cueing     Gait Training:  Distance: 300 feet   Gait analysis: steady, reciprocal gait pattern, decreased walking speed, slight limp d/t leg length discrepancy   Assistance/assistive device: supervision/standby assistance and cueing for activity pacing;   gait belt, patient denies need for assistive device     Stair Training:   not tested, patient does not have to negotiate stairs at home     Therapeutic exercises:  HEP reviewed/independent, patient performed a few reps of each:   ankle pumps, long arc quads (LAQ), encouraged deep breathing for pain relief and relaxation     ACTIVITY TOLERANCE:     - good tolerance to activity during PT session  - requires increased time  - motivated to increase activity  - pain is well managed    FINAL LOCATION:     Seated in bed side chair, all needs within reach. Patient agrees to call for assistance. Positioned bed and chair to face TV. Patient is glad to be OOB.       Thank you for this referral.  Devin Going, PT, DPT

## 2023-06-09 NOTE — Plan of Care (Signed)
Problem: Pain  Goal: Verbalizes/displays adequate comfort level or baseline comfort level  06/09/2023 0329 by Floria Raveling, RN  Outcome: Progressing  06/08/2023 1358 by Bryan Lemma, RN  Outcome: Progressing     Problem: ABCDS Injury Assessment  Goal: Absence of physical injury  06/09/2023 0329 by Floria Raveling, RN  Outcome: Progressing  06/08/2023 1358 by Bryan Lemma, RN  Outcome: Progressing     Problem: Discharge Planning  Goal: Discharge to home or other facility with appropriate resources  06/09/2023 0329 by Floria Raveling, RN  Outcome: Progressing  06/08/2023 1358 by Bryan Lemma, RN  Outcome: Progressing

## 2023-07-22 IMAGING — CR DG LUMBAR SPINE COMPLETE 4+V
5 series · 5 of 5 positions shown · non-contrast
Comparison: X-ray lumbar spine 03/07/2021, CT abdomen pelvis
03/21/2021

CLINICAL DATA: back pain

EXAM:
LUMBAR SPINE - COMPLETE 4+ VIEW

[l-spine obl (1 of 2)]
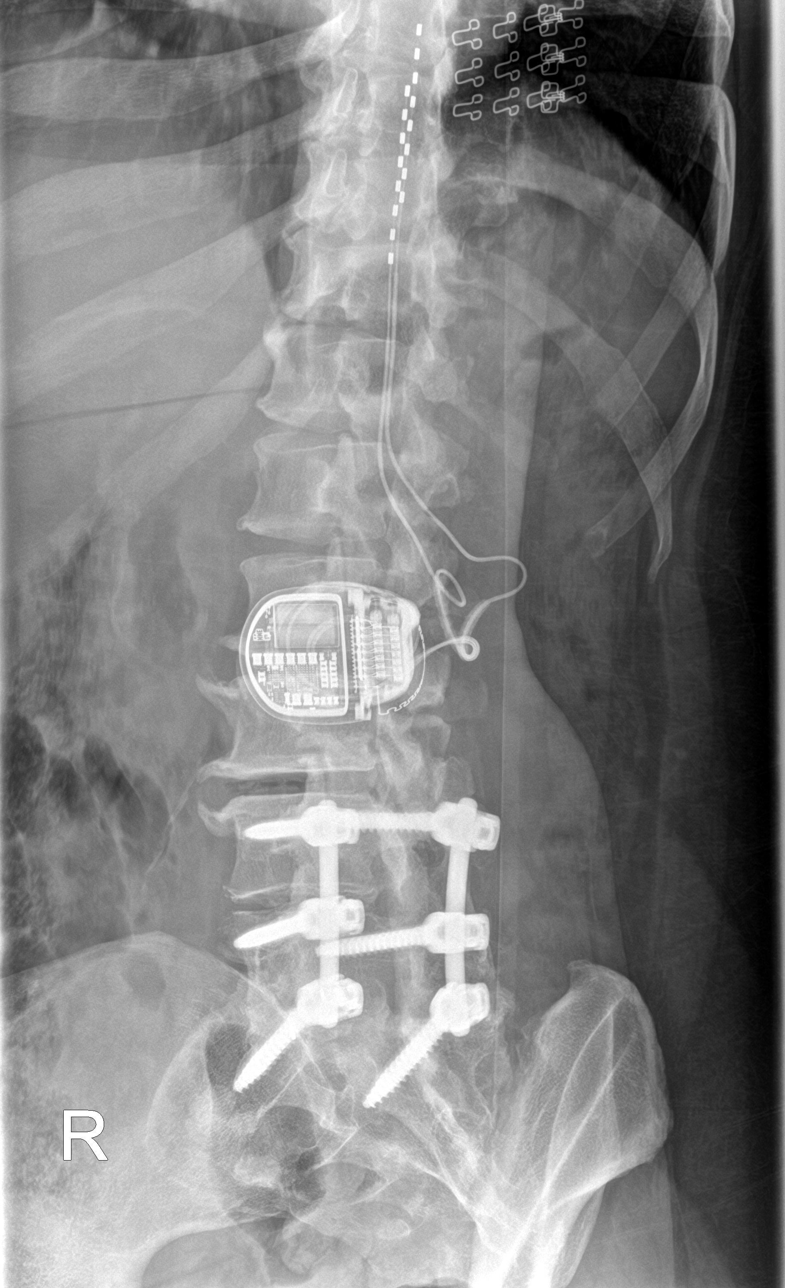

[l-spine obl (2 of 2)]
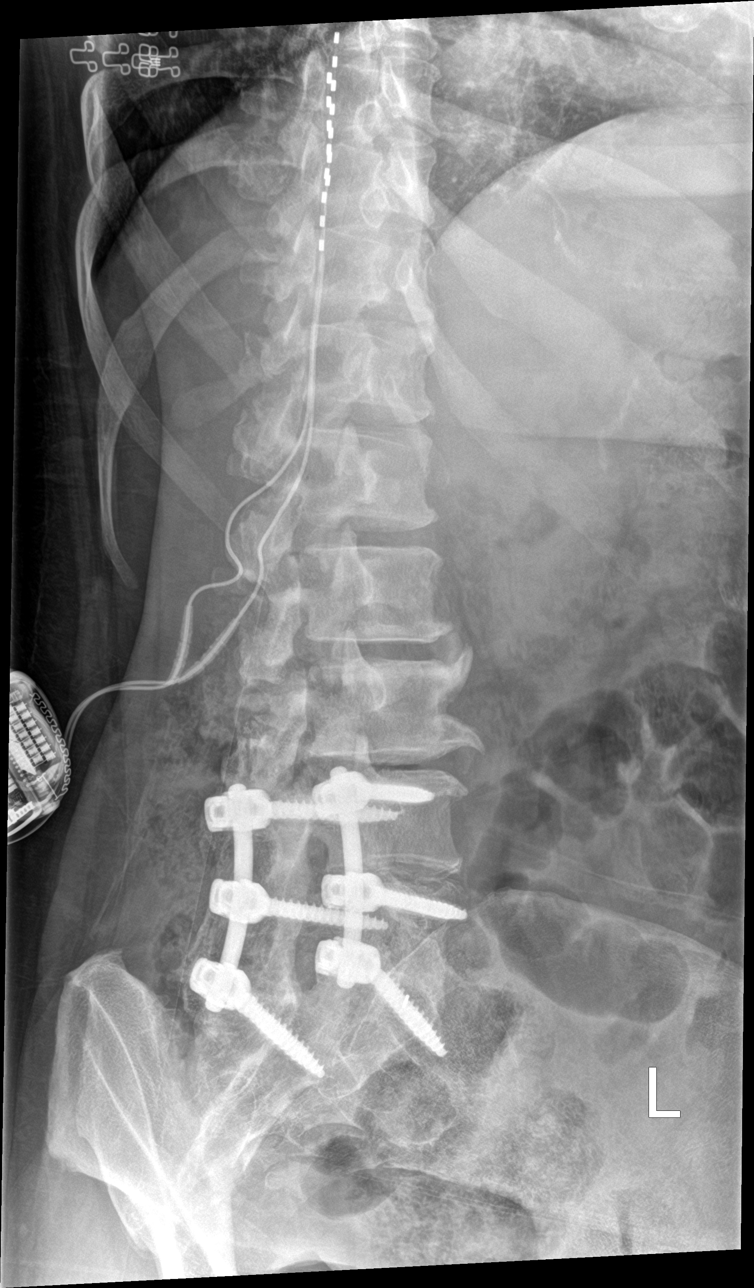

[l-spine lat]
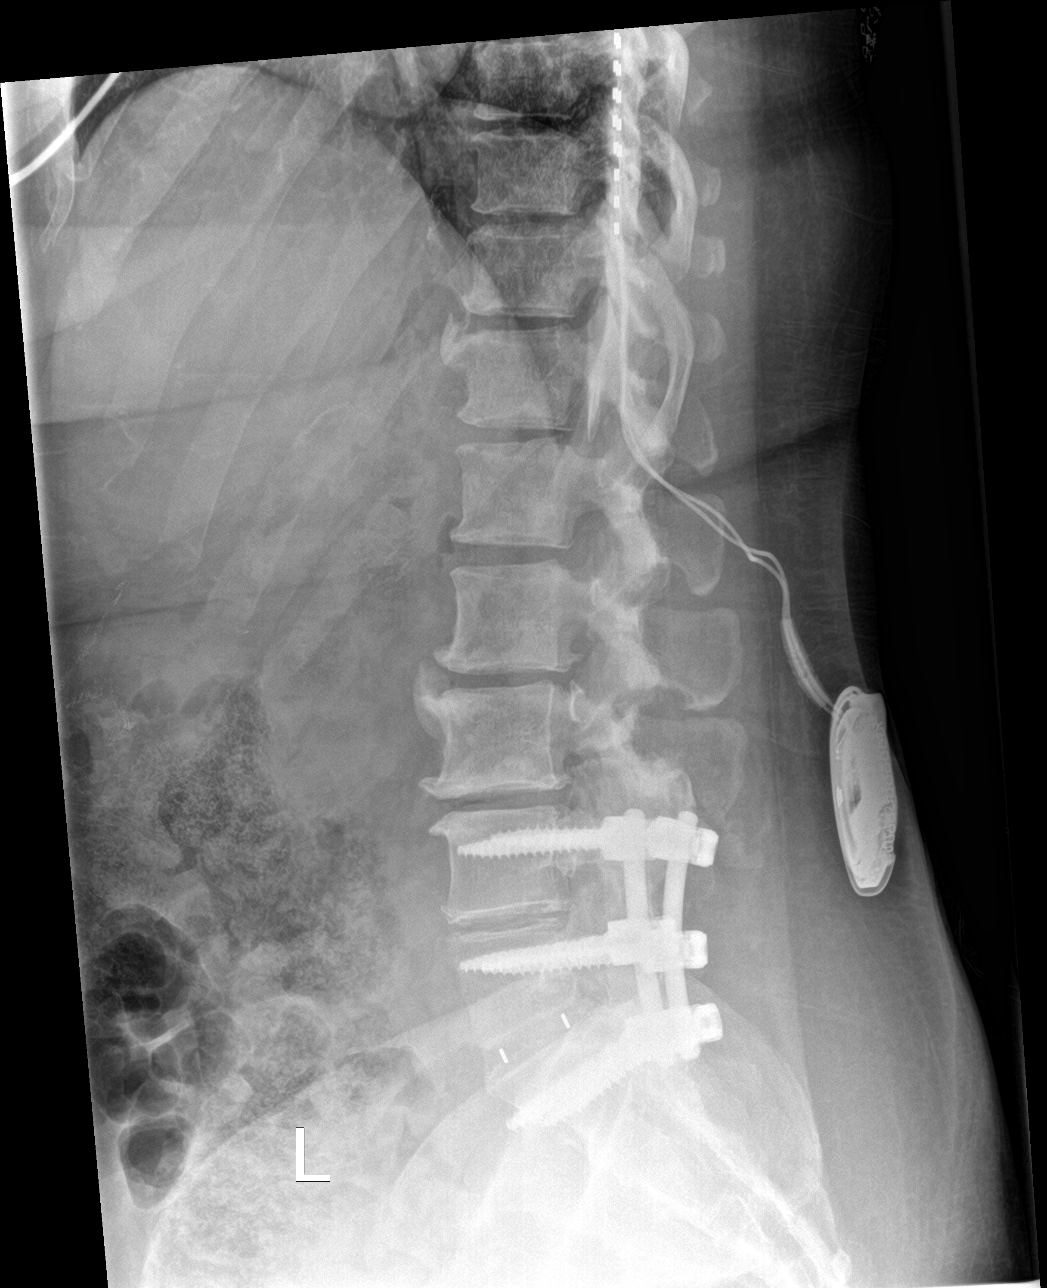

[l-spine ap]
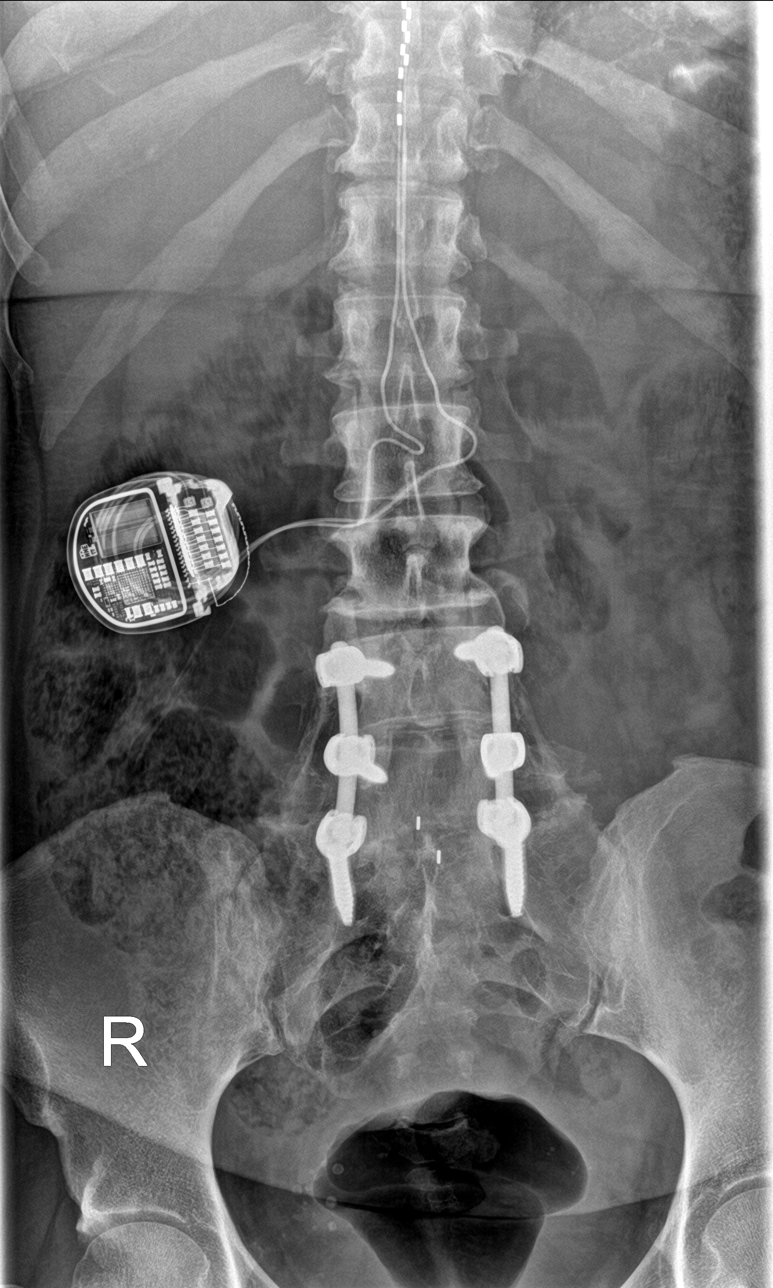

[l-spine spot]
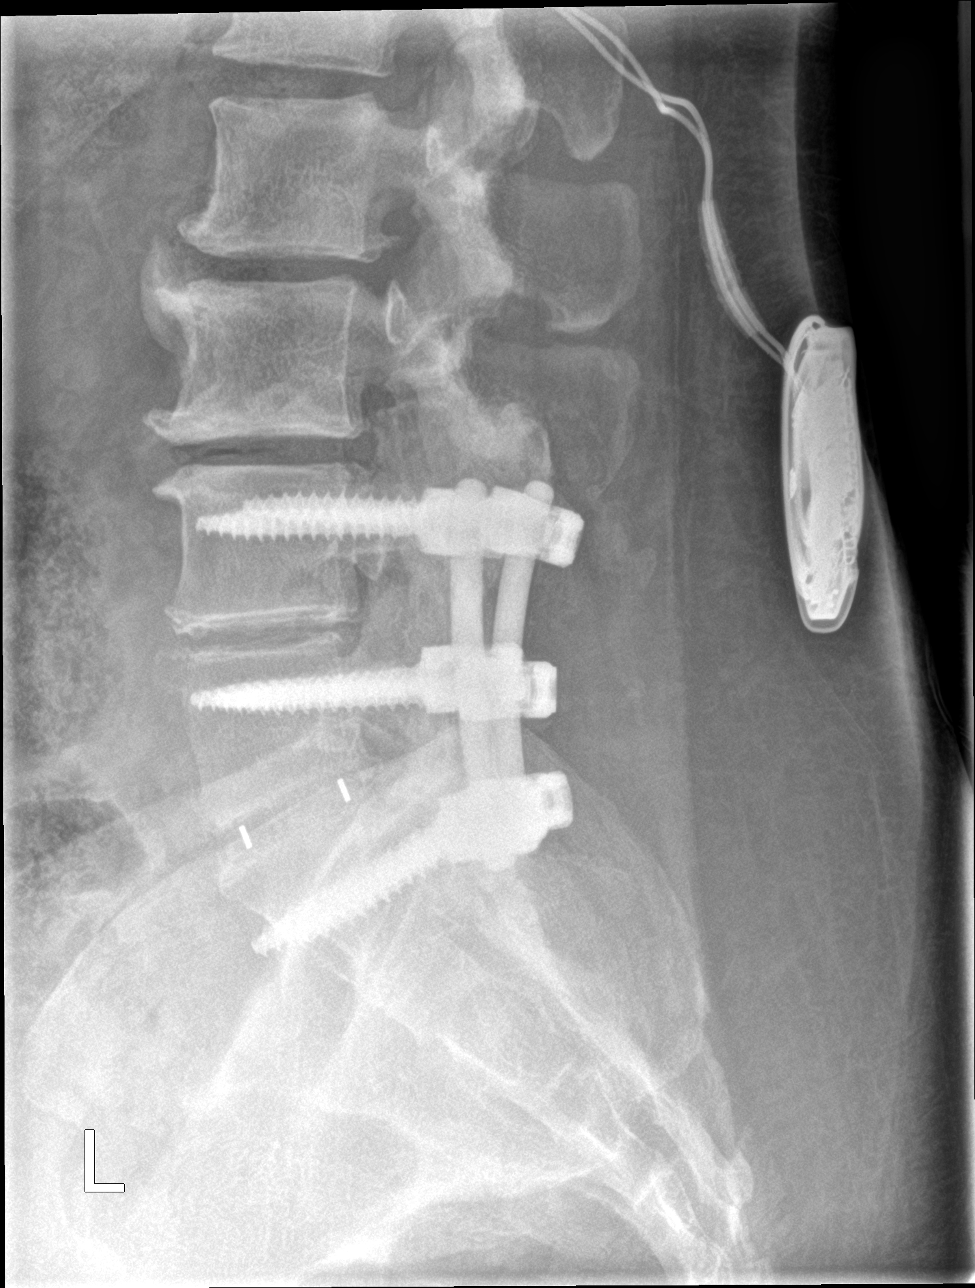

[5 of 5 positions shown; findings below may reference images not displayed]

FINDINGS: Limited evaluation due to overlapping osseous structures and
overlying soft tissues.

Five non-rib-bearing lumbar vertebral bodies.

Redemonstration of L4 through S1 posterolateral fusion and L5-S1
interbody fusion. No radiographic findings suggest surgical hardware
complication. Multilevel mild to moderate degenerative changes of
the spine with bulky osteophyte formation, facet arthropathy,
intervertebral disc space vacuum non. There is no evidence of lumbar
spine fracture. Alignment is normal.

Neural stimulator noted with leads overlying the posterior central
canal at the T9-T11 levels.

:
1. Negative for acute traumatic injury in a patient status post L4 -
S1 posterolateral fusion L5-S1 interbody fusion surgical hardware.
2. Limited evaluation due to overlapping osseous structures and
overlying soft tissues.

## 2023-10-04 ENCOUNTER — Ambulatory Visit: Admit: 2023-10-04 | Discharge: 2023-10-04 | Payer: PRIVATE HEALTH INSURANCE | Attending: Family | Primary: Specialist

## 2023-10-04 DIAGNOSIS — N200 Calculus of kidney: Secondary | ICD-10-CM

## 2023-10-04 LAB — AMB POC URINALYSIS DIP STICK AUTO W/O MICRO
Bilirubin, Urine, POC: NEGATIVE
Glucose, Urine, POC: NEGATIVE
Ketones, Urine, POC: NEGATIVE
Leukocyte Esterase, Urine, POC: NEGATIVE
Nitrite, Urine, POC: NEGATIVE
Specific Gravity, Urine, POC: 1.03 (ref 1.001–1.035)
Urobilinogen, POC: 0.2
pH, Urine, POC: 5.5 (ref 4.6–8.0)

## 2023-10-04 NOTE — Progress Notes (Signed)
Jillian Harvey  DOB: 12-Jul-1976  Encounter Date: 10/04/2023       Dr. Letta Median is my supervising physician today   Encounter Diagnoses   Name Primary?    Kidney stone Yes    Ureteral stone with hydronephrosis         ASSESSMENT:  -Nephrolithiasis.   Surgical History:  NA     Medical Therapy: NA  Stone Composition: NA  24 Urine Panel: NA    Last Imaging:   CT AP 03/21/21: Kidneys are normal, without renal calculi, solid lesion, or hydronephrosis. Bladder is unremarkable.   CT AP 03/08/22: Kidneys are normal, without renal calculi, focal lesion, or hydronephrosis.  Bladder is unremarkable.   CT AP 10/01/23: 3 mm stone in the left mid ureter results in mild to moderate left-sided hydronephrosis.     Gastric sleeve - 11/2020    PLAN:  Reviewed CT scan from 10/01/23.   Patient asymptomatic  Discussed stone prevention measures  Patient is tea drinker - education provided   RUS ordered to ensure resolution in hydro. Telemed to review.      DISCUSSION: We discussed the options for the management of kidney/ureteral stones, including trial of passage, ESWL, ureteroscopy with laser lithotripsy and PCNL. The risk and benefits of each option were discussed with the patient      Chief Complaint   Patient presents with    Nephrolithiasis     Pt presents for 3mm kidney stone. Pt states lower back pain on the (R) side and a few days ago she experienced lower back pain on the (L) side.       HISTORY OF PRESENT ILLNESS:   Jillian Harvey is a 47 y.o. female who presents today in consultation for nephrolithiasis.     Patient presented to ER 10/01/23 with abdominal pain. Creatinine 0.9. WBC 16.9.  CT imaging revealed 3 mm stone in the left mid ureter results in mild to moderate left-sided hydronephrosis. UCx mixed culture 10,000-40,000 org/mL more than 2 different organisms. Culture appears to be contaminated with skin flora.. Discharged with ciprofloxacin antibiotic with end date of 10/08/23.     Patient is doing  well today. She reports pain resolved soon after ER visit. She did not visualize stone passage.    First kidney stone  Tea drinker        Past Medical History:   Diagnosis Date    Asthma      Past Surgical History:   Procedure Laterality Date    BACK SURGERY      CERVICAL FUSION      CESAREAN SECTION      HYSTERECTOMY (CERVIX STATUS UNKNOWN)      JOINT REPLACEMENT      LUMBAR SPINE SURGERY N/A 06/08/2023    LUMBAR DECOMPRESSION L2-4 performed by Arita Miss, MD at [facility] MAIN OR    SPINAL CORD STIMULATOR IMPLANT      TUBAL LIGATION       Family History   Problem Relation Age of Onset    Cancer Father      Social History     Socioeconomic History    Marital status: Single     Spouse name: Not on file    Number of children: Not on file    Years of education: Not on file    Highest education level: Not on file   Occupational History    Not on file   Tobacco Use    Smoking status: Never  Smokeless tobacco: Never   Vaping Use    Vaping status: Some Days    Substances: CBD   Substance and Sexual Activity    Alcohol use: Yes     Comment: social    Drug use: Defer    Sexual activity: Defer   Other Topics Concern    Not on file   Social History Narrative    Not on file     Social Determinants of Health     Financial Resource Strain: Not on file   Food Insecurity: No Food Insecurity (06/08/2023)    Hunger Vital Sign     Worried About Running Out of Food in the Last Year: Never true     Ran Out of Food in the Last Year: Never true   Transportation Needs: No Transportation Needs (06/08/2023)    PRAPARE - Therapist, art (Medical): No     Lack of Transportation (Non-Medical): No   Physical Activity: Not on file   Stress: Not on file   Social Connections: Feeling Socially Integrated (04/19/2023)    OASIS D0700: Social Isolation     Frequency of experiencing loneliness or isolation: Never   Intimate Partner Violence: Unknown (03/20/2023)    Received from [facility], [facility]     Abuse Indicators     Interpersonal Safety: Not on file   Housing Stability: Low Risk  (06/08/2023)    Housing Stability Vital Sign     Unable to Pay for Housing in the Last Year: No     Number of Places Lived in the Last Year: 1     Unstable Housing in the Last Year: No     Allergies   Allergen Reactions    Latex Itching, Other (See Comments), Rash, Shortness Of Breath and Swelling     PATIENT STATES "ONLY ALLERGIC TO THE POWDER INSIDE LATEX GLOVES"    PATIENT STATES "ONLY ALLERGIC TO THE POWDER INSIDE LATEX GLOVES", PATIENT STATES "ONLY ALLERGIC TO THE POWDER INSIDE LATEX GLOVES", PATIENT STATES "ONLY ALLERGIC TO THE POWDER INSIDE LATEX GLOVES", PATIENT STATES "ONLY ALLERGIC TO THE POWDER INSIDE LATEX GLOVES", PATIENT STATES "ONLY ALLERGIC TO THE POWDER INSIDE LATEX GLOVES", PATIENT STATES "ONLY ALLERGIC TO THE POWDER INSIDE LATEX GLOVES", PATIENT STATES "ONLY ALLERGIC TO THE POWDER INSIDE LATEX GLOVES", PATIENT STATES "ONLY ALLERGIC TO THE POWDER IN    PATIENT STATES "ONLY ALLERGIC TO THE POWDER INSIDE LATEX GLOVES"   PATIENT STATES "ONLY ALLERGIC TO THE POWDER INSIDE LATEX GLOVES"   PATIENT STATES "ONLY ALLERGIC TO THE POWDER INSIDE LATEX GLOVES"   PATIENT STATES "ONLY ALLERGIC TO THE POWDER INSIDE LATEX GLOVES"   PATIENT STATES "ONLY ALLERGIC TO THE POWDER INSIDE LATEX GLOVES"   PATIENT STATES "ONLY ALLERGIC TO THE POWDER INSIDE LATEX GLOVES"   PATIENT STATES "ONLY ALLERGIC TO THE POWDER INSIDE LATEX GLOVES"   PATIENT STATES "ONLY ALLERGIC TO THE POWDER INSIDE LATEX GLOVES"    PATIENT STATES "ONLY ALLERGIC TO THE POWDER INSIDE LATEX GLOVES"   PATIENT STATES "ONLY ALLERGIC TO THE POWDER INSIDE LATEX GLOVES"    PATIENT STATES "ONLY ALLERGIC TO THE POWDER INSIDE LATEX GLOVES"   PATIENT STATES "ONLY ALLERGIC TO THE POWDER INSIDE LATEX GLOVES"   PATIENT STATES "ONLY ALLERGIC TO THE POWDER INSIDE LATEX GLOVES"   PATIENT STATES "ONLY ALLERGIC TO THE POWDER INSIDE LATEX GLOVES"    Hydrocodone Anaphylaxis     Hydrocodone-Acetaminophen Anaphylaxis, Other (See Comments) and Swelling     HEART  RACING    Other reaction(s): Other (See Comments), other/intolerance   HEART RACING    HEART RACING, HEART RACING, Other reaction(s): other/intolerance, HEART RACING, HEART RACING, Other reaction(s): other/intolerance, Other reaction(s): other/intolerance, HEART RACING, HEART RACING, HEART RACING, HEART RACING    HEART RACING   HEART RACING   Other reaction(s): other/intolerance   HEART RACING   HEART RACING   Other reaction(s): other/intolerance   Other reaction(s): other/intolerance   HEART RACING   HEART RACING   HEART RACING   HEART RACING    Other reaction(s): other/intolerance   HEART RACING   HEART RACING    Other reaction(s): other/intolerance   Other reaction(s): other/intolerance   HEART RACING   HEART RACING   HEART RACING   HEART RACING    Penicillins Anaphylaxis and Other (See Comments)    Methocarbamol Itching and Rash    Methylprednisolone Hives, Itching and Rash    Nsaids Nausea And Vomiting     Due to weight loss surgery    Oxycodone-Acetaminophen Itching     Current Outpatient Medications   Medication Sig Dispense Refill    acetaminophen-codeine (TYLENOL #4) 300-60 MG per tablet TAKE 1 TABLET BY MOUTH EVERY 8 HOURS AS NEEDED FOR SEVERE PAIN. UP TO 14 DAYS      ciprofloxacin (CIPRO) 500 MG tablet Take 1 tablet by mouth in the morning and 1 tablet in the evening.      docusate (COLACE, DULCOLAX) 100 MG CAPS TAKE 1 CAPSULE EVERY DAY BY ORAL ROUTE AS DIRECTED, FOR CONSTIPATION.      linaclotide (LINZESS) 145 MCG capsule Take 1 tablet by mouth daily      metoclopramide (REGLAN) 10 MG tablet Take 1 tablet by mouth 4 times daily      ondansetron (ZOFRAN-ODT) 4 MG disintegrating tablet PLACE 1 TABLET TWICE A DAY BY TRANSLINGUAL ROUTE FOR 3 DAYS.      tamsulosin (FLOMAX) 0.4 MG capsule Take 1 capsule by mouth      acetaminophen (TYLENOL) 325 MG tablet Take 2 tablets by mouth every 6 hours      acetaminophen (TYLENOL) 500 MG  tablet Take 2 tablets by mouth every 8 hours as needed      albuterol sulfate HFA (PROVENTIL;VENTOLIN;PROAIR) 108 (90 Base) MCG/ACT inhaler TAKE 2 PUFFS BY MOUTH EVERY 4 TO 6 HOURS AS NEEDED FOR BREATHING      escitalopram (LEXAPRO) 10 MG tablet Take 3 tablets by mouth daily      traZODone (DESYREL) 50 MG tablet 1 tablet      traMADol (ULTRAM) 50 MG tablet Take 1 tablet by mouth every 8 hours as needed for Pain.      acetaminophen (TYLENOL) 500 MG tablet Take 2 tablets by mouth every 8 hours as needed for Pain      HYDROmorphone (DILAUDID) 2 MG tablet Take 1 tablet by mouth every 4 hours as needed for Pain. 1-2 tablets      albuterol sulfate HFA (PROVENTIL;VENTOLIN;PROAIR) 108 (90 Base) MCG/ACT inhaler Inhale 2 puffs into the lungs every 4 hours as needed for Shortness of Breath      diphenhydrAMINE HCl (BENADRYL PO) Take 25 mg by mouth in the morning and 25 mg in the evening. take with famotidine to help prevent rash on face        cyclobenzaprine (FLEXERIL) 10 MG tablet Take 1 tablet by mouth 3 times daily      DULoxetine (CYMBALTA) 30 MG extended release capsule Take 1 capsule by mouth daily  famotidine (PEPCID) 20 MG tablet Take 1 tablet by mouth 2 times daily      hydrOXYzine HCl (ATARAX) 25 MG tablet Take 1 tablet by mouth every 8 hours as needed for Anxiety      omeprazole (PRILOSEC) 40 MG delayed release capsule Take 1 capsule by mouth daily      pregabalin (LYRICA) 75 MG capsule Take 2 capsules by mouth 2 times daily.      PROBIOTIC, LACTOBACILLUS, PO Take 1 tablet by mouth daily.       No current facility-administered medications for this visit.         Physical Examination  Resp 16   Ht 1.626 m (5\' 4" )   Wt 88.5 kg (195 lb)   BMI 33.47 kg/m   Constitutional: Well developed, well-nourished female in no acute distress.   CV:  No peripheral swelling noted  Respiratory: No respiratory distress or difficulties  Skin: No bruising or rashes.  No petechia.    Neuro/Psych:  Patient with appropriate affect.   Alert and oriented.        Labs/Radiology:  Results for orders placed or performed in visit on 10/04/23   AMB POC URINALYSIS DIP STICK AUTO W/O MICRO   Result Value Ref Range    Color, Urine, POC Yellow     Clarity, Urine, POC Clear     Glucose, Urine, POC Negative     Bilirubin, Urine, POC Negative     Ketones, Urine, POC Negative     Specific Gravity, Urine, POC 1.030 1.001 - 1.035    Blood, Urine, POC Trace-intact Negative    pH, Urine, POC 5.5 4.6 - 8.0    Protein, Urine, POC 1+     Urobilinogen, POC 0.2 mg/dL     Nitrite, Urine, POC Negative     Leukocyte Esterase, Urine, POC Negative      CT AP 10/01/23:  Impression:  3 mm stone in the left mid ureter results in mild to moderate left-sided hydronephrosis.   Findings:  Kidneys and Ureters: 3 mm stone in the left mid ureter results in mild to moderate left-sided hydronephrosis. Delayed left nephrogram. Normal right kidney and ureter.   Bladder: Negative.   CT AP 03/08/22:  Impression:  No acute process demonstrated in the abdomen or pelvis. No   evidence of bowel obstruction or inflammation. Appendix is normal.   Small esophageal hiatal hernia.   Postoperative gastric sleeve and hysterectomy.   Degenerative changes and postoperative changes in the lumbar spine.   Findings:  Kidneys are normal, without renal calculi, focal lesion, or hydronephrosis.   Bladder is unremarkable.   CT AP 03/21/21:  Impression:  No acute CT findings of the abdomen or pelvis to explain lower   abdominal pain.   Status post Roux type gastric bypass.   Sigmoid diverticulosis without evidence of acute diverticulitis.   Status post hysterectomy.   Findings:  Kidneys are normal, without renal calculi, solid lesion, or hydronephrosis. Bladder is unremarkable.       CC: Arita Miss, MD     Karrie Meres, NP-C  [facility] [place]  [address]   [place], [place] [address]  [phone] (office)      Medical documentation is prepped with the assistance of Ruksar Anjum for  Karrie Meres, NP-C on 10/04/2023

## 2023-10-04 NOTE — Telephone Encounter (Signed)
Jillian Harvey has an order for RUS      ALL FEMALE PATIENTS NEEDING AN MRI OF PELVIS OR PROSTATE, MUST BE SCHEDULED AT ONE OF THE FOLLOWING:    **MRI/CT (Preferred [facility])  **[facility] (Preferred [facility]  **[facility] (Preferred [place])  [facility]  [facility]  [facility]      To be done at [facility]    Needed by:  10/25/2023  **NOW**  Patient has a follow-up appointment:  Yes   11/03/2023  If MRI, does patient have a pacemaker:   No    Order has been placed in connect care:  Yes    Is this a STAT order:  No

## 2023-10-06 NOTE — Telephone Encounter (Signed)
Faxed imaging to [facility]; [facility] [phone];

## 2023-10-19 ENCOUNTER — Encounter

## 2023-11-03 ENCOUNTER — Telehealth: Admit: 2023-11-03 | Payer: PRIVATE HEALTH INSURANCE | Attending: Family | Primary: Specialist

## 2023-11-03 DIAGNOSIS — N200 Calculus of kidney: Secondary | ICD-10-CM

## 2023-11-03 NOTE — Progress Notes (Unsigned)
TELEMEDICINE    Jillian Harvey  DOB: [birth year]  Encounter Date: 11/03/2023       No diagnosis found.      ASSESSMENT:  -Nephrolithiasis.   Surgical History:  NA     Medical Therapy: NA  Stone Composition: NA  24 Urine Panel: NA    Last Imaging:   CT AP 03/21/21: Kidneys are normal, without renal calculi, solid lesion, or hydronephrosis. Bladder is unremarkable.   CT AP 03/08/22: Kidneys are normal, without renal calculi, focal lesion, or hydronephrosis.  Bladder is unremarkable.   CT AP 10/01/23: 3 mm stone in the left mid ureter results in mild to moderate left-sided hydronephrosis.   RUS 10/12/23: Unremarkable kidneys. No collecting system dilation or hydronephrosis.    PLAN:  Reviewed CT scan from 10/01/23.   Patient asymptomatic  Discussed stone prevention measures  Patient is tea drinker - education provided   RUS ordered to ensure resolution in hydro. Telemed to review.    DISCUSSION: We discussed the options for the management of kidney/ureteral stones, including trial of passage, ESWL, ureteroscopy with laser lithotripsy and PCNL. The risk and benefits of each option were discussed with the patient    No chief complaint on file.    HISTORY OF PRESENT ILLNESS: Jillian Harvey is a 47 y.o. female with nephrolithiasis, who presents today via TeleMed to review RUS results to ensure resolution of hydronephrosis. Patient presented to ER 10/01/23 with abdominal pain. Creatinine 0.9. WBC 16.9.  CT imaging revealed 3 mm stone in the left mid ureter results in mild to moderate left-sided hydronephrosis. UCx mixed culture 10,000-40,000 org/mL more than 2 different organisms, culture appears to be contaminated with skin flora.. Discharged with ciprofloxacin 500 mg antibiotic with end date of 10/08/23.    Most recent RUS on 10/12/23 showed unremarkable kidneys. No collecting system dilation or hydronephrosis.     Today, patient reports ***  *** interval stone episodes   Denies flank pain, gross  hematuria, dysuria, asymptomatic for infection. No f/c/n/v.     Prior Hx:  10/04/23: The patient was last seen by me with nephrolithiasis. Patient presented to ER 10/01/23 with abdominal pain. Creatinine 0.9. WBC 16.9.  CT imaging revealed 3 mm stone in the left mid ureter results in mild to moderate left-sided hydronephrosis. UCx mixed culture 10,000-40,000 org/mL more than 2 different organisms. Culture appears to be contaminated with skin flora.. Discharged with ciprofloxacin antibiotic with end date of 10/08/23.     Patient is doing well today. She reports pain resolved soon after ER visit. She did not visualize stone passage.    First kidney stone  Tea drinker    Past Medical History:   Diagnosis Date    Asthma      Past Surgical History:   Procedure Laterality Date    BACK SURGERY      CERVICAL FUSION      CESAREAN SECTION      HYSTERECTOMY (CERVIX STATUS UNKNOWN)      JOINT REPLACEMENT      LUMBAR SPINE SURGERY N/A 06/08/2023    LUMBAR DECOMPRESSION L2-4 performed by Arita Miss, MD at [facility] MAIN OR    SPINAL CORD STIMULATOR IMPLANT      TUBAL LIGATION       Family History   Problem Relation Age of Onset    Cancer Father      Social History     Socioeconomic History    Marital status: Single     Spouse name:  Not on file    Number of children: Not on file    Years of education: Not on file    Highest education level: Not on file   Occupational History    Not on file   Tobacco Use    Smoking status: Never    Smokeless tobacco: Never   Vaping Use    Vaping status: Some Days    Substances: CBD   Substance and Sexual Activity    Alcohol use: Yes     Comment: social    Drug use: Defer    Sexual activity: Defer   Other Topics Concern    Not on file   Social History Narrative    Not on file     Social Determinants of Health     Financial Resource Strain: Not on file   Food Insecurity: No Food Insecurity (06/08/2023)    Hunger Vital Sign     Worried About Running Out of Food in the Last Year: Never true     Ran Out of  Food in the Last Year: Never true   Transportation Needs: No Transportation Needs (06/08/2023)    PRAPARE - Therapist, art (Medical): No     Lack of Transportation (Non-Medical): No   Physical Activity: Not on file   Stress: Not on file   Social Connections: Feeling Socially Integrated (04/19/2023)    OASIS D0700: Social Isolation     Frequency of experiencing loneliness or isolation: Never   Intimate Partner Violence: Unknown (03/20/2023)    Received from [facility], [facility]    Abuse Indicators     Interpersonal Safety: Not on file   Housing Stability: Low Risk  (06/08/2023)    Housing Stability Vital Sign     Unable to Pay for Housing in the Last Year: No     Number of Places Lived in the Last Year: 1     Unstable Housing in the Last Year: No     Allergies   Allergen Reactions    Latex Itching, Other (See Comments), Rash, Shortness Of Breath and Swelling     PATIENT STATES "ONLY ALLERGIC TO THE POWDER INSIDE LATEX GLOVES"    PATIENT STATES "ONLY ALLERGIC TO THE POWDER INSIDE LATEX GLOVES", PATIENT STATES "ONLY ALLERGIC TO THE POWDER INSIDE LATEX GLOVES", PATIENT STATES "ONLY ALLERGIC TO THE POWDER INSIDE LATEX GLOVES", PATIENT STATES "ONLY ALLERGIC TO THE POWDER INSIDE LATEX GLOVES", PATIENT STATES "ONLY ALLERGIC TO THE POWDER INSIDE LATEX GLOVES", PATIENT STATES "ONLY ALLERGIC TO THE POWDER INSIDE LATEX GLOVES", PATIENT STATES "ONLY ALLERGIC TO THE POWDER INSIDE LATEX GLOVES", PATIENT STATES "ONLY ALLERGIC TO THE POWDER IN    PATIENT STATES "ONLY ALLERGIC TO THE POWDER INSIDE LATEX GLOVES"   PATIENT STATES "ONLY ALLERGIC TO THE POWDER INSIDE LATEX GLOVES"   PATIENT STATES "ONLY ALLERGIC TO THE POWDER INSIDE LATEX GLOVES"   PATIENT STATES "ONLY ALLERGIC TO THE POWDER INSIDE LATEX GLOVES"   PATIENT STATES "ONLY ALLERGIC TO THE POWDER INSIDE LATEX GLOVES"   PATIENT STATES "ONLY ALLERGIC TO THE POWDER INSIDE LATEX GLOVES"   PATIENT STATES "ONLY ALLERGIC TO THE  POWDER INSIDE LATEX GLOVES"   PATIENT STATES "ONLY ALLERGIC TO THE POWDER INSIDE LATEX GLOVES"    PATIENT STATES "ONLY ALLERGIC TO THE POWDER INSIDE LATEX GLOVES"   PATIENT STATES "ONLY ALLERGIC TO THE POWDER INSIDE LATEX GLOVES"    PATIENT STATES "ONLY ALLERGIC TO THE POWDER INSIDE LATEX GLOVES"   PATIENT  STATES "ONLY ALLERGIC TO THE POWDER INSIDE LATEX GLOVES"   PATIENT STATES "ONLY ALLERGIC TO THE POWDER INSIDE LATEX GLOVES"   PATIENT STATES "ONLY ALLERGIC TO THE POWDER INSIDE LATEX GLOVES"    Hydrocodone Anaphylaxis    Hydrocodone-Acetaminophen Anaphylaxis, Other (See Comments) and Swelling     HEART RACING    Other reaction(s): Other (See Comments), other/intolerance   HEART RACING    HEART RACING, HEART RACING, Other reaction(s): other/intolerance, HEART RACING, HEART RACING, Other reaction(s): other/intolerance, Other reaction(s): other/intolerance, HEART RACING, HEART RACING, HEART RACING, HEART RACING    HEART RACING   HEART RACING   Other reaction(s): other/intolerance   HEART RACING   HEART RACING   Other reaction(s): other/intolerance   Other reaction(s): other/intolerance   HEART RACING   HEART RACING   HEART RACING   HEART RACING    Other reaction(s): other/intolerance   HEART RACING   HEART RACING    Other reaction(s): other/intolerance   Other reaction(s): other/intolerance   HEART RACING   HEART RACING   HEART RACING   HEART RACING    Penicillins Anaphylaxis and Other (See Comments)    Methocarbamol Itching and Rash    Methylprednisolone Hives, Itching and Rash    Nsaids Nausea And Vomiting     Due to weight loss surgery    Oxycodone-Acetaminophen Itching     Current Outpatient Medications   Medication Sig Dispense Refill    acetaminophen-codeine (TYLENOL #4) 300-60 MG per tablet TAKE 1 TABLET BY MOUTH EVERY 8 HOURS AS NEEDED FOR SEVERE PAIN. UP TO 14 DAYS      docusate (COLACE, DULCOLAX) 100 MG CAPS TAKE 1 CAPSULE EVERY DAY BY ORAL ROUTE AS DIRECTED, FOR CONSTIPATION.      linaclotide (LINZESS) 145  MCG capsule Take 1 tablet by mouth daily      metoclopramide (REGLAN) 10 MG tablet Take 1 tablet by mouth 4 times daily      ondansetron (ZOFRAN-ODT) 4 MG disintegrating tablet PLACE 1 TABLET TWICE A DAY BY TRANSLINGUAL ROUTE FOR 3 DAYS.      tamsulosin (FLOMAX) 0.4 MG capsule Take 1 capsule by mouth      acetaminophen (TYLENOL) 325 MG tablet Take 2 tablets by mouth every 6 hours      acetaminophen (TYLENOL) 500 MG tablet Take 2 tablets by mouth every 8 hours as needed      albuterol sulfate HFA (PROVENTIL;VENTOLIN;PROAIR) 108 (90 Base) MCG/ACT inhaler TAKE 2 PUFFS BY MOUTH EVERY 4 TO 6 HOURS AS NEEDED FOR BREATHING      escitalopram (LEXAPRO) 10 MG tablet Take 3 tablets by mouth daily      traZODone (DESYREL) 50 MG tablet 1 tablet      traMADol (ULTRAM) 50 MG tablet Take 1 tablet by mouth every 8 hours as needed for Pain.      acetaminophen (TYLENOL) 500 MG tablet Take 2 tablets by mouth every 8 hours as needed for Pain      HYDROmorphone (DILAUDID) 2 MG tablet Take 1 tablet by mouth every 4 hours as needed for Pain. 1-2 tablets      albuterol sulfate HFA (PROVENTIL;VENTOLIN;PROAIR) 108 (90 Base) MCG/ACT inhaler Inhale 2 puffs into the lungs every 4 hours as needed for Shortness of Breath      diphenhydrAMINE HCl (BENADRYL PO) Take 25 mg by mouth in the morning and 25 mg in the evening. take with famotidine to help prevent rash on face        cyclobenzaprine (FLEXERIL) 10 MG tablet  Take 1 tablet by mouth 3 times daily      DULoxetine (CYMBALTA) 30 MG extended release capsule Take 1 capsule by mouth daily      famotidine (PEPCID) 20 MG tablet Take 1 tablet by mouth 2 times daily      hydrOXYzine HCl (ATARAX) 25 MG tablet Take 1 tablet by mouth every 8 hours as needed for Anxiety      omeprazole (PRILOSEC) 40 MG delayed release capsule Take 1 capsule by mouth daily      pregabalin (LYRICA) 75 MG capsule Take 2 capsules by mouth 2 times daily.      PROBIOTIC, LACTOBACILLUS, PO Take 1 tablet by mouth daily.       No  current facility-administered medications for this visit.     Physical Examination  There were no vitals taken for this visit.  Constitutional: Well developed, well-nourished female in no acute distress.   CV:  No peripheral swelling noted  Respiratory: No respiratory distress or difficulties  Skin: No bruising or rashes.  No petechia.    Neuro/Psych:  Patient with appropriate affect.  Alert and oriented.      Labs/Radiology:  No results found for this visit on 11/03/23.    RUS 10/12/23:  Impression:  Unremarkable kidneys.  Findings:  Right Kidney: 10.1 cm in size.   Echogenicity: Normal   Parenchyma: Normal thickness and contour   No collecting system dilation or hydronephrosis.   Masses: No solid mass   Cysts:  No simple or complex cysts    Calculi: None present   Left Kidney: 10.3 cm in size.   Echogenicity: Normal   Parenchyma: Normal thickness and contour   No collecting system dilation or hydronephrosis.   Masses: No solid mass   Cysts:  No simple or complex cysts    Calculi: None present   Urinary Bladder: No wall thickening or intraluminal debris. Post void residual not measured.     CT AP 10/01/23:  Impression:  3 mm stone in the left mid ureter results in mild to moderate left-sided hydronephrosis.   Findings:  Kidneys and Ureters: 3 mm stone in the left mid ureter results in mild to moderate left-sided hydronephrosis. Delayed left nephrogram. Normal right kidney and ureter.   Bladder: Negative.     CT AP 03/08/22:  Impression:  No acute process demonstrated in the abdomen or pelvis. No   evidence of bowel obstruction or inflammation. Appendix is normal.   Small esophageal hiatal hernia.   Postoperative gastric sleeve and hysterectomy.   Degenerative changes and postoperative changes in the lumbar spine.   Findings:  Kidneys are normal, without renal calculi, focal lesion, or hydronephrosis.   Bladder is unremarkable.     CT AP 03/21/21:  Impression:  No acute CT findings of the abdomen or pelvis to  explain lower   abdominal pain.   Status post Roux type gastric bypass.   Sigmoid diverticulosis without evidence of acute diverticulitis.   Status post hysterectomy.   Findings:  Kidneys are normal, without renal calculi, solid lesion, or hydronephrosis. Bladder is unremarkable.     CC: Arita Miss, MD     Karrie Meres, NP-C  [facility] [place]  [address]   [place], [place] [address]  [phone] (office)      Medical documentation is prepped with the assistance of Kriste Basque. K for  Karrie Meres, NP-C on 11/02/2023    Yitzel L Harvey, was evaluated through a synchronous (real-time) audio-video encounter.  The patient (or guardian if applicable) is aware that this is a billable service, which includes applicable co-pays. This Virtual Visit was conducted with patient's (and/or legal guardian's) consent. The visit was conducted pursuant to the emergency declaration under the [employer] and the [employer], 1135 waiver authority and the [employer] and [employer] Act. It is within this context (and with the understanding that this method of patient encounter is in the patient's best interest as well as the health and safety of other patients and the public) that telehealth is being provided for this patient encounter rather than a face-to-face visit. This patient encounter is appropriate and reasonable under the circumstances given the patient's particular presentation at this time. The patient has been advised of the potential risks and limitations of this mode of treatment (including, but not limited to, the absence of in-person examination) and has agreed to be treated in a remote fashion. The patient has also been advised to contact this office for worsening conditions or problems, and seek emergency medical treatment and/or call 911 if the patient deems either necessary. Patient identification was verified, and a caregiver was  present when appropriate, prior to the initiation of the visit.

## 2024-03-07 ENCOUNTER — Other Ambulatory Visit: Payer: Self-pay | Admitting: Medical Genetics

## 2024-03-09 NOTE — Progress Notes (Unsigned)
 Chief Complaint:follow-up, food stuck in throat Primary GI Doctor:Rhonda Guerrero  HPI: Rhonda Guerrero is a pleasant 48 year old African-American female, new to GI today referred by Rhonda Dawley, PA-C/Rhonda Guerrero for evaluation of episodic severe abdominal pain. Patient just relocated to [facility] in January and had previously been living in [place]. She underwent a duodenal switch procedure/bariatric surgery on 11/29/2020 done in [place].  She says she has successfully lost 57 pounds since that time.  She was not having any abdominal pain prior to surgery and says she started having pain in early January.  She has had 4 discrete episodes of this pain which she says is quite severe constant hard pain that last for about 2 hours.  This pain doubles her over in a fetal position, is not colicky, no radiation to her back, not associated with any nausea vomiting or diarrhea.  No associated fever.  She says it feels as if her whole abdomen hurts with these episodes and she is unable to localize. Her last episode was about a week ago.  In between these episodes she has been feeling okay without any significant abdominal pain.  She has been following prescribed diet.  She is not on any aspirin or NSAIDs.  She was given a prescription for Protonix postoperatively to use as needed for reflux but says she has not been having a lot of problems with acid reflux. Patient does have history of prior C-section, hysterectomy and bilateral tubal ligation.  She is also had a prior cervical fusion and lumbar fusion and suffers from chronic back pain which is what she says motivated her to have the bariatric surgery hoping this would improve her back pain.  She uses tramadol as needed for back pain and is also on gabapentin. Patient mentions that she was told that her liver enzymes were normal in Rhonda Guerrero 2021 she had a set of liver tests in December with one of her transaminases mildly elevated in the 53 range.  She believes that  LFTs in January were normal.  03/12/2021 patient last seen in the office by Amy, PA for complaints of abdominal pain.  At that time lab work and CT scan of abdomen and pelvis ordered. 03/21/2021 CT ABDOMEN AND PELVIS WITH CONTRAST  IMPRESSION: 1. No acute CT findings of the abdomen or pelvis to explain lower abdominal pain. 2. Status post Roux type gastric bypass. 3. Sigmoid diverticulosis without evidence of acute diverticulitis. 4. Status post hysterectomy.  10/29/2021 colonoscopy with Dr. Myrtie Neither Impression:  - One diminutive polyp at the hepatic flexure, removed with a cold snare. Resected and retrieved.  - Diverticulosis in the left colon and in the right colon.  - The examination was otherwise normal on direct and retroflexion views. 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  Interval History  Patient admits/denies GERD Patient admits/denies dysphagia Patient admits/denies nausea, vomiting, or weight loss  Patient admits/denies altered bowel habits Patient admits/denies abdominal pain Patient admits/denies rectal bleeding   Denies/Admits alcohol Denies/Admits smoking Denies/Admits NSAID use. Denies/Admits they are on blood thinners.  Patients last colonoscopy Patients last EGD  Patient's family history includes  Wt Readings from Last 3 Encounters:  10/29/21 175 lb (79.4 kg)  10/14/21 175 lb (79.4 kg)  03/12/21 202 lb (91.6 kg)      Past Guerrero History:  Diagnosis Date   Asthma    Blood transfusion without reported diagnosis    With bone surgery in 2010   GERD (gastroesophageal  reflux disease)    Hypercholesterolemia    Insomnia    Mixed anxiety and depressive disorder     Past Surgical History:  Procedure Laterality Date   ABDOMINAL HYSTERECTOMY     BARIATRIC SURGERY     Gastric sleeve and duodenal switch   CESAREAN SECTION     SPINE SURGERY     TUBAL LIGATION     WISDOM TOOTH  EXTRACTION      Current Outpatient Medications  Medication Sig Dispense Refill   acetaminophen (TYLENOL) 325 MG tablet Take by mouth.     albuterol (VENTOLIN HFA) 108 (90 Base) MCG/ACT inhaler Inhale 2 puffs into the lungs 4 (four) times daily as needed.     ascorbic acid (VITAMIN C) 1000 MG tablet Take by mouth.     benzonatate (TESSALON) 100 MG capsule Take 1 capsule (100 mg total) by mouth every 8 (eight) hours as needed for cough. 21 capsule 0   buPROPion (WELLBUTRIN XL) 150 MG 24 hr tablet Take 1 tablet by mouth daily.     cyclobenzaprine (FLEXERIL) 10 MG tablet Take 1 tablet by mouth at bedtime.     escitalopram (LEXAPRO) 20 MG tablet Take 1 tablet by mouth daily.     famotidine (PEPCID) 20 MG tablet Take 1 tablet (20 mg total) by mouth 2 (two) times daily. 30 tablet 0   fluconazole (DIFLUCAN) 150 MG tablet Take 1 tablet (150 mg total) by mouth daily. 1 tablet 0   Lactobacillus Rhamnosus, GG, (RA PROBIOTIC DIGESTIVE CARE) CAPS Take 1 capsule by mouth daily.     Multiple Vitamins-Minerals (MULTIVITAL PO) Take by mouth.     omeprazole (PRILOSEC) 40 MG capsule OPEN 1 CAPSULE AND SPRINKLE INTO APPLESAUCE EVERY MORNING. 90 capsule 2   predniSONE (DELTASONE) 20 MG tablet Take 2 tablets (40 mg total) by mouth daily with breakfast. 10 tablet 0   promethazine-dextromethorphan (PROMETHAZINE-DM) 6.25-15 MG/5ML syrup Take 5 mLs by mouth 3 (three) times daily as needed for cough. 200 mL 0   traMADol (ULTRAM) 50 MG tablet Take 50 mg by mouth 2 (two) times daily as needed.     No current facility-administered medications for this visit.    Allergies as of 03/10/2024 - Review Complete 01/08/2023  Allergen Reaction Noted   Latex Shortness Of Breath, Itching, Swelling, and Rash 03/24/2018   Penicillins Anaphylaxis, Shortness Of Breath, Swelling, and Rash 11/21/2006   Hydrocodone-acetaminophen Swelling 11/24/2006   Methocarbamol Itching and Rash 02/21/2020   Methylprednisolone Hives, Itching, and  Rash 04/12/2020   Oxycodone-acetaminophen Itching, Rash, and Hives 11/25/2020    Family History  Problem Relation Age of Onset   Colon polyps Mother    Asthma Mother    Hypertension Mother    Hypercholesterolemia Mother    Esophageal cancer Father    Other Father        Malignant Tumor of Lung   Cancer - Other Father        Pharangeal carcinoma   Colon cancer Neg Hx    Rectal cancer Neg Hx    Stomach cancer Neg Hx     Review of Systems:    Constitutional: No weight loss, fever, chills, weakness or fatigue HEENT: Eyes: No change in vision               Ears, Nose, Throat:  No change in hearing or congestion Skin: No rash or itching Cardiovascular: No chest pain, chest pressure or palpitations   Respiratory: No SOB or cough Gastrointestinal: See HPI and otherwise  negative Genitourinary: No dysuria or change in urinary frequency Neurological: No headache, dizziness or syncope Musculoskeletal: No new muscle or joint pain Hematologic: No bleeding or bruising Psychiatric: No history of depression or anxiety    Physical Exam:  Vital signs: There were no vitals taken for this visit.  Constitutional:   Pleasant *** female appears to be in NAD, Well developed, Well nourished, alert and cooperative Head:  Normocephalic and atraumatic. Eyes:   PEERL, EOMI. No icterus. Conjunctiva pink. Ears:  Normal auditory acuity. Neck:  Supple Throat: Oral cavity and pharynx without inflammation, swelling or lesion.  Respiratory: Respirations even and unlabored. Lungs clear to auscultation bilaterally.   No wheezes, crackles, or rhonchi.  Cardiovascular: Normal S1, S2. Regular rate and rhythm. No peripheral edema, cyanosis or pallor.  Gastrointestinal:  Soft, nondistended, nontender. No rebound or guarding. Normal bowel sounds. No appreciable masses or hepatomegaly. Rectal:  Not performed.  Anoscopy: Msk:  Symmetrical without gross deformities. Without edema, no deformity or joint  abnormality.  Neurologic:  Alert and  oriented x4;  grossly normal neurologically.  Skin:   Dry and intact without significant lesions or rashes. Psychiatric: Oriented to person, place and time. Demonstrates good judgement and reason without abnormal affect or behaviors.  RELEVANT LABS AND IMAGING: CBC    Latest Ref Rng & Units 03/08/2022    7:30 PM 03/12/2021    4:31 PM  CBC  WBC 4.0 - 10.5 K/uL 12.4  10.3   Hemoglobin 12.0 - 15.0 g/dL 36.6  44.0   Hematocrit 36.0 - 46.0 % 40.5  38.2   Platelets 150 - 400 K/uL 350  436.0      CMP     Latest Ref Rng & Units 03/08/2022    7:30 PM 03/12/2021    4:31 PM  CMP  Glucose 70 - 99 mg/dL 87  347   BUN 6 - 20 mg/dL 10  7   Creatinine 4.25 - 1.00 mg/dL 9.56  3.87   Sodium 564 - 145 mmol/L 138  139   Potassium 3.5 - 5.1 mmol/L 3.5  4.1   Chloride 98 - 111 mmol/L 103  105   CO2 22 - 32 mmol/L 29  26   Calcium 8.9 - 10.3 mg/dL 8.3  9.4   Total Protein 6.5 - 8.1 g/dL 6.0  7.7   Total Bilirubin 0.3 - 1.2 mg/dL 0.3  0.4   Alkaline Phos 38 - 126 U/L 75  86   AST 15 - 41 U/L 18  17   ALT 0 - 44 U/L 37  22      No results found for: "TSH"   Assessment: 1. ***  Plan: -Schedule EGD. The risks and benefits of EGD with possible biopsies and esophageal dilation were discussed with the patient who agrees to proceed.    Thank you for the courtesy of this consult. Please call me with any questions or concerns.   Kenya Shiraishi, FNP-C [facility] 03/09/2024, 1:26 PM  Cc: Rhonda Dawley, PA-C

## 2024-03-10 ENCOUNTER — Ambulatory Visit: Payer: BC Managed Care – PPO | Admitting: Gastroenterology

## 2024-03-13 ENCOUNTER — Other Ambulatory Visit (HOSPITAL_COMMUNITY)
Admission: RE | Admit: 2024-03-13 | Discharge: 2024-03-13 | Disposition: A | Discharge status: Home / Self Care | Payer: Self-pay | Source: Ambulatory Visit | Attending: Oncology | Admitting: Oncology

## 2024-03-24 LAB — GENECONNECT MOLECULAR SCREEN: Genetic Analysis Overall Interpretation: NEGATIVE

## 2024-05-12 ENCOUNTER — Emergency Department (HOSPITAL_COMMUNITY)

## 2024-05-12 ENCOUNTER — Other Ambulatory Visit: Payer: Self-pay

## 2024-05-12 ENCOUNTER — Emergency Department (HOSPITAL_COMMUNITY)
Admission: EM | Admit: 2024-05-12 | Discharge: 2024-05-12 | Disposition: A | Discharge status: Home / Self Care | Attending: Emergency Medicine | Admitting: Emergency Medicine

## 2024-05-12 DIAGNOSIS — J209 Acute bronchitis, unspecified: Secondary | ICD-10-CM | POA: Insufficient documentation

## 2024-05-12 DIAGNOSIS — Z91040 Latex allergy status: Secondary | ICD-10-CM | POA: Insufficient documentation

## 2024-05-12 DIAGNOSIS — R079 Chest pain, unspecified: Secondary | ICD-10-CM | POA: Diagnosis present

## 2024-05-12 LAB — BASIC METABOLIC PANEL WITH GFR
Anion gap: 10 (ref 5–15)
BUN: 7 mg/dL (ref 6–20)
CO2: 23 mmol/L (ref 22–32)
Calcium: 9.1 mg/dL (ref 8.9–10.3)
Chloride: 106 mmol/L (ref 98–111)
Creatinine, Ser: 0.85 mg/dL (ref 0.44–1.00)
GFR, Estimated: >60 mL/min (ref >60)
Glucose, Bld: 90 mg/dL (ref 70–99)
Potassium: 4.3 mmol/L (ref 3.5–5.1)
Sodium: 139 mmol/L (ref 135–145)

## 2024-05-12 LAB — CBC
HCT: 41.7 % (ref 36.0–46.0)
Hemoglobin: 14.1 g/dL (ref 12.0–15.0)
MCH: 31.1 pg (ref 26.0–34.0)
MCHC: 33.8 g/dL (ref 30.0–36.0)
MCV: 91.9 fL (ref 80.0–100.0)
Platelets: 365 10*3/uL (ref 150–400)
RBC: 4.54 MIL/uL (ref 3.87–5.11)
RDW: 13.4 % (ref 11.5–15.5)
WBC: 8.3 10*3/uL (ref 4.0–10.5)
nRBC: 0 % (ref 0.0–0.2)

## 2024-05-12 LAB — RESP PANEL BY RT-PCR (RSV, FLU A&B, COVID)  RVPGX2
Influenza A by PCR: NEGATIVE
Influenza B by PCR: NEGATIVE
Resp Syncytial Virus by PCR: NEGATIVE
SARS Coronavirus 2 by RT PCR: NEGATIVE

## 2024-05-12 LAB — D-DIMER, QUANTITATIVE: D-Dimer, Quant: 1.13 ug{FEU}/mL — ABNORMAL HIGH (ref 0.00–0.50)

## 2024-05-12 MED ORDER — AZITHROMYCIN 250 MG PO TABS: 250.0000 mg | ORAL_TABLET | Freq: Every day | ORAL | 0 refills | Status: AC

## 2024-05-12 MED ORDER — ALBUTEROL SULFATE HFA 108 (90 BASE) MCG/ACT IN AERS: 2.0000 | INHALATION_SPRAY | Freq: Four times a day (QID) | RESPIRATORY_TRACT | 0 refills | Status: AC | PRN

## 2024-05-12 MED ORDER — IOHEXOL 350 MG/ML SOLN
75.0000 mL | Freq: Once | INTRAVENOUS | Status: AC | PRN
  Administered 2024-05-12: 75 mL via INTRAVENOUS

## 2024-05-12 MED ORDER — AZITHROMYCIN 250 MG PO TABS
500.0000 mg | ORAL_TABLET | Freq: Once | ORAL | Status: AC
Start: 2024-05-12 — End: 2024-05-12
  Administered 2024-05-12: 500 mg via ORAL
  Filled 2024-05-12: qty 2

## 2024-05-12 MED ORDER — DEXAMETHASONE SODIUM PHOSPHATE 10 MG/ML IJ SOLN
10.0000 mg | Freq: Once | INTRAMUSCULAR | Status: AC
  Administered 2024-05-12: 10 mg via INTRAVENOUS
  Filled 2024-05-12: qty 1

## 2024-05-12 MED ORDER — PREDNISONE 20 MG PO TABS: 40.0000 mg | ORAL_TABLET | Freq: Every day | ORAL | 0 refills | Status: DC

## 2024-05-12 NOTE — ED Provider Notes (Signed)
 [facility] AT [facility] Provider Note   CSN: [id number] Arrival date & time: 05/12/24  1111     History  Chief Complaint  Patient presents with   Shortness of Breath   Cough   Nasal Congestion    Rhonda Guerrero is a 48 y.o. female.  HPI Patient presents with chest pain, hemoptysis.  She notes that for the past month she has had a cough, but over the past week she has developed chest pain, right lower lateral.  Over the past day or 2 she developed hemoptysis.  No fever, no abdominal pain, no nausea, no vomiting.    Home Medications Prior to Admission medications   Medication Sig Start Date End Date Taking? Authorizing Provider  azithromycin (ZITHROMAX) 250 MG tablet Take 1 tablet (250 mg total) by mouth daily for 4 days. Take 1 every day until finished. 05/12/24 05/16/24 Yes Dorenda Gandy, MD  predniSONE  (DELTASONE ) 20 MG tablet Take 2 tablets (40 mg total) by mouth daily with breakfast. For the next four days 05/12/24  Yes Dorenda Gandy, MD  acetaminophen (TYLENOL) 325 MG tablet Take by mouth.    [provider]  albuterol (VENTOLIN HFA) 108 (90 Base) MCG/ACT inhaler Inhale 2 puffs into the lungs 4 (four) times daily as needed. 05/12/24   Dorenda Gandy, MD  ascorbic acid (VITAMIN C) 1000 MG tablet Take by mouth.    [provider]  benzonatate  (TESSALON ) 100 MG capsule Take 1 capsule (100 mg total) by mouth every 8 (eight) hours as needed for cough. 11/02/21   Dodson Freestone, FNP  buPROPion (WELLBUTRIN XL) 150 MG 24 hr tablet Take 1 tablet by mouth daily. 10/01/14   [provider]  cyclobenzaprine (FLEXERIL) 10 MG tablet Take 1 tablet by mouth at bedtime. 10/12/19   [provider]  escitalopram (LEXAPRO) 20 MG tablet Take 1 tablet by mouth daily. 10/01/14   [provider]  famotidine  (PEPCID ) 20 MG tablet Take 1 tablet (20 mg total) by mouth 2 (two) times daily. 01/08/23   Adolph Hoop, PA-C   fluconazole  (DIFLUCAN ) 150 MG tablet Take 1 tablet (150 mg total) by mouth daily. 11/02/21   Dodson Freestone, FNP  Lactobacillus Rhamnosus, GG, (RA PROBIOTIC DIGESTIVE CARE) CAPS Take 1 capsule by mouth daily.    [provider]  Multiple Vitamins-Minerals (MULTIVITAL PO) Take by mouth.    [provider]  omeprazole  (PRILOSEC) 40 MG capsule OPEN 1 CAPSULE AND SPRINKLE INTO APPLESAUCE EVERY MORNING. 10/27/21   Esterwood, Amy S, PA-C  predniSONE  (DELTASONE ) 20 MG tablet Take 2 tablets (40 mg total) by mouth daily with breakfast. 01/08/23   Adolph Hoop, PA-C  promethazine -dextromethorphan (PROMETHAZINE -DM) 6.25-15 MG/5ML syrup Take 5 mLs by mouth 3 (three) times daily as needed for cough. 01/08/23   Adolph Hoop, PA-C  traMADol (ULTRAM) 50 MG tablet Take 50 mg by mouth 2 (two) times daily as needed. 09/15/21   [provider]      Allergies    Latex, Penicillins, Hydrocodone-acetaminophen, Methocarbamol, Methylprednisolone, and Oxycodone-acetaminophen    Review of Systems   Review of Systems  Physical Exam Updated Vital Signs BP (!) 120/57   Pulse 81   Temp 97.6 F (36.4 C)   Resp 20   SpO2 100%  Physical Exam Vitals and nursing note reviewed.  Constitutional:      General: She is not in acute distress.    Appearance: She is well-developed.  HENT:     Head:  Normocephalic and atraumatic.  Eyes:     Conjunctiva/sclera: Conjunctivae normal.  Cardiovascular:     Rate and Rhythm: Normal rate and regular rhythm.  Pulmonary:     Effort: Pulmonary effort is normal. No respiratory distress.     Breath sounds: Normal breath sounds. No stridor.  Abdominal:     General: There is no distension.  Skin:    General: Skin is warm and dry.  Neurological:     Mental Status: She is alert and oriented to person, place, and time.     Cranial Nerves: No cranial nerve deficit.  Psychiatric:        Mood and Affect: Mood normal.     ED Results / Procedures / Treatments    Labs (all labs ordered are listed, but only abnormal results are displayed) Labs Reviewed  D-DIMER, QUANTITATIVE - Abnormal; Notable for the following components:      Result Value   D-Dimer, Quant 1.13 (*)    All other components within normal limits  RESP PANEL BY RT-PCR (RSV, FLU A&B, COVID)  [id number]  BASIC METABOLIC PANEL WITH GFR  CBC  HCG, SERUM, QUALITATIVE    EKG EKG Interpretation Date/Time:  Friday May 12 2024 13:15:59 EDT Ventricular Rate:  83 PR Interval:  128 QRS Duration:  72 QT Interval:  370 QTC Calculation: 434 R Axis:   89  Text Interpretation: Normal sinus rhythm Nonspecific T wave abnormality Abnormal ECG No previous ECGs available Confirmed by Dorenda Gandy [phone]) on 05/12/2024 6:06:49 PM  Radiology CT Angio Chest PE W and/or Wo Contrast Result Date: 05/12/2024 CLINICAL DATA:  Pulmonary embolism (PE) suspected, low to intermediate prob, positive D-dimer. Shortness of breath EXAM: CT ANGIOGRAPHY CHEST WITH CONTRAST TECHNIQUE: Multidetector CT imaging of the chest was performed using the standard protocol during bolus administration of intravenous contrast. Multiplanar CT image reconstructions and MIPs were obtained to evaluate the vascular anatomy. RADIATION DOSE REDUCTION: This exam was performed according to the departmental dose-optimization program which includes automated exposure control, adjustment of the mA and/or kV according to patient size and/or use of iterative reconstruction technique. CONTRAST:  75mL OMNIPAQUE  IOHEXOL  350 MG/ML SOLN COMPARISON:  Chest x-ray today. FINDINGS: Cardiovascular: No filling defects in the pulmonary arteries to suggest pulmonary emboli. Heart is normal size. Aorta is normal caliber. Mediastinum/Nodes: No mediastinal, hilar, or axillary adenopathy. Trachea and esophagus are unremarkable. Thyroid unremarkable. Lungs/Pleura: No confluent opacities or effusions. Upper Abdomen: No acute findings.  Small hiatal hernia.  Musculoskeletal: Chest wall soft tissues are unremarkable. No acute bony abnormality. Review of the MIP images confirms the above findings. IMPRESSION: No evidence of pulmonary embolus. No acute cardiopulmonary disease. Small hiatal hernia. Electronically Signed   By: Janeece Mechanic M.D.   On: 05/12/2024 19:13   DG Chest 2 View Result Date: 05/12/2024 CLINICAL DATA:  Shortness of breath.  Cough and nasal congestion EXAM: CHEST - 2 VIEW COMPARISON:  X-ray 01/08/2023 FINDINGS: Hyperinflation. There is some linear opacity left lung base likely scar or atelectasis. No consolidation, pneumothorax or effusion. No edema. Normal cardiopericardial silhouette. Fixation hardware along the lower cervical spine. Spinal neurostimulator leads along the lower thoracic spine central canal. Mild peribronchial thickening. Degenerative changes of the spine IMPRESSION: Hyperinflation. Slight peribronchial thickening. There is some linear opacity left lung base. Atelectasis is favored. Electronically Signed   By: Adrianna Horde M.D.   On: 05/12/2024 13:10    Procedures Procedures    Medications Ordered in ED Medications  dexamethasone (DECADRON) injection 10 mg (10  mg Intravenous Given 05/12/24 1637)  azithromycin (ZITHROMAX) tablet 500 mg (500 mg Oral Given 05/12/24 1637)  iohexol  (OMNIPAQUE ) 350 MG/ML injection 75 mL (75 mLs Intravenous Contrast Given 05/12/24 1902)    ED Course/ Medical Decision Making/ A&P                                 Medical Decision Making Patient with cough, congestion in spite of multiple outpatient visits, now with chest pain, hemoptysis.  Differential includes pneumonia, bronchitis, PE. Pulse ox 100% room air normal  Amount and/or Complexity of Data Reviewed Independent Historian:     Details: Daughter at bedside Labs: ordered. Decision-making details documented in ED Course. Radiology: ordered and independent interpretation performed. Decision-making details documented in ED  Course. ECG/medicine tests: ordered and independent interpretation performed. Decision-making details documented in ED Course.  Risk Prescription drug management.   6:06 PM D-dimer positive CTA pending 7:41 PM Patient in no distress, somewhat improved after initial steroids, azithromycin.  CT reviewed, no evidence for pulmonary embolism.  No evidence for pneumonia, as above concern for bronchitis given her persistent cough, pain.  We discussed appropriate outpatient therapy, follow-up, given these reassuring findings, absence of evidence for bacteremia, sepsis, or intrinsic cardiac issue, patient discharged in stable condition.       Final Clinical Impression(s) / ED Diagnoses Final diagnoses:  Acute bronchitis, unspecified organism    Rx / DC Orders ED Discharge Orders          Ordered    azithromycin (ZITHROMAX) 250 MG tablet  Daily        05/12/24 1940    predniSONE  (DELTASONE ) 20 MG tablet  Daily with breakfast        05/12/24 1940    albuterol (VENTOLIN HFA) 108 (90 Base) MCG/ACT inhaler  4 times daily PRN        05/12/24 1940              Dorenda Gandy, MD 05/12/24 1941

## 2024-05-12 NOTE — ED Triage Notes (Signed)
 Patient reports nasal congestion and productive cough x 2 months. Seen by PCP who recommended Robitussin and she states she's gone through two bottles. Reports sob x 1 week and is using her inhaler without relief. SOB worse when lying flat. Continues to cough and states two times per day there is blood tinged sputum.

## 2024-05-12 NOTE — Discharge Instructions (Signed)
 You have been diagnosed with bronchitis.  This is typically an inflammatory disorder of the lungs, though symptoms infection contributes to the discomfort and cough.  Please take all medication as directed, follow-up with your physician.  Return here for concerning changes in your condition.  As we discussed, use the albuterol every 4 hours for the next 2 days.  You may then use it as needed.

## 2024-05-12 NOTE — ED Provider Triage Note (Signed)
 Emergency Medicine Provider Triage Evaluation Note  Rhonda Guerrero , a 48 y.o. female  was evaluated in triage.  Pt complains of shortness of breath.  Pt has had asthma n the past.    Review of Systems  Positive: Short of breath and right sided pain Negative: fever  Physical Exam  BP 113/77 (BP Location: Right Arm)   Pulse 94   Temp 98.6 F (37 C)   Resp (!) 22   SpO2 98%  Gen:   Awake, no distress   Resp:  Normal effort  MSK:   Moves extremities without difficulty  Other:    Medical Decision Making  Medically screening exam initiated at 12:27 PM.  Appropriate orders placed.  Rhonda Guerrero was informed that the remainder of the evaluation will be completed by another provider, this initial triage assessment does not replace that evaluation, and the importance of remaining in the ED until their evaluation is complete.     Sandi Crosby, PA-C 05/12/24 1230

## 2024-05-12 NOTE — ED Notes (Signed)
 Cnc hcg, serum qualitative...KM

## 2024-07-24 ENCOUNTER — Other Ambulatory Visit: Payer: Self-pay | Admitting: Physician Assistant

## 2024-07-24 DIAGNOSIS — Z1231 Encounter for screening mammogram for malignant neoplasm of breast: Secondary | ICD-10-CM

## 2024-08-02 ENCOUNTER — Encounter

## 2024-08-02 DIAGNOSIS — Z1231 Encounter for screening mammogram for malignant neoplasm of breast: Secondary | ICD-10-CM

## 2024-08-10 ENCOUNTER — Ambulatory Visit
Admission: RE | Admit: 2024-08-10 | Discharge: 2024-08-10 | Disposition: A | Discharge status: Home / Self Care | Source: Ambulatory Visit | Attending: Physician Assistant | Admitting: Physician Assistant

## 2024-08-10 DIAGNOSIS — Z1231 Encounter for screening mammogram for malignant neoplasm of breast: Secondary | ICD-10-CM

## 2024-09-05 NOTE — Progress Notes (Unsigned)
 Rhonda Guerrero [id number] 06-04-1976   Chief Complaint:  Referring Provider: Winfred Pilsner, PA-C Primary GI MD: Dr. San  HPI: Rhonda Guerrero is a 48 y.o. 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    Last seen in office 02/2021 by Greig Corti, PA-C.  At that time was about 3.5 months out from bariatric surgery with duodenal switch procedure done in [place] .  Had relocated to [place].  Was having episodes of severe abdominal pain lasting about 2 hours.  Was doing well in between episodes.  Labs at that time showed normal CBC, CMP, lipase.  CT A/P 03/21/2021 showed no acute CT findings of the abdomen or pelvis to explain lower abdominal pain, sigmoid diverticulosis without evidence of acute diverticulitis.  Underwent colonoscopy 10/2021 with finding of 1 small tubular adenoma and diverticulosis in the left colon and in the right colon.  Recommended recall 7 years.  CT A/P 03/08/2022 similarly showed no acute process in the abdomen or pelvis, though there was finding of a small esophageal hiatal hernia.  Seen in the ED 05/12/2024 for chest pain, hemoptysis, cough, shortness of breath.  Normal CBC and BMP.  D-dimer was positive but no evidence of pulmonary embolism on CTA.  She was treated for bronchitis.   Here for complaint of encopresis   Previous GI Procedures/Imaging   CT A/P 03/08/2022 1. No acute process demonstrated in the abdomen or pelvis. No evidence of bowel obstruction or inflammation. Appendix is normal. 2. Small esophageal hiatal hernia. 3. Postoperative gastric sleeve and hysterectomy. 4. Degenerative changes and postoperative changes in the lumbar spine.  Colonoscopy 10/29/2021 (done by Dr. Legrand) - One diminutive polyp at the hepatic flexure, removed with a cold snare. Resected  and retrieved.  - Diverticulosis in the left colon and in the right colon.  - The examination was otherwise normal on direct and retroflexion views. -Recall 7 years Path: Surgical [P], colon, ascending, polyp (1) - TUBULAR ADENOMA WITHOUT HIGH GRADE DYSPLASIA.  CT A/P 03/21/2021 1. No acute CT findings of the abdomen or pelvis to explain lower abdominal pain. 2. Status post Roux type gastric bypass. 3. Sigmoid diverticulosis without evidence of acute diverticulitis. 4. Status post hysterectomy.   Past Medical History:  Diagnosis Date   Asthma    Blood transfusion without reported diagnosis    With bone surgery in 2010   GERD (gastroesophageal reflux disease)    Hypercholesterolemia    Insomnia    Mixed anxiety and depressive disorder     Past Surgical History:  Procedure Laterality Date   ABDOMINAL HYSTERECTOMY     BARIATRIC SURGERY     Gastric sleeve and duodenal switch   CESAREAN SECTION     SPINE SURGERY     TUBAL LIGATION     WISDOM TOOTH EXTRACTION      Current Outpatient Medications  Medication Sig Dispense Refill   acetaminophen (TYLENOL) 325 MG tablet Take by mouth.     albuterol  (VENTOLIN  HFA) 108 (90 Base) MCG/ACT inhaler Inhale 2 puffs into the lungs 4 (four) times daily as needed. 1 each 0   ascorbic acid (VITAMIN C) 1000 MG tablet Take by mouth.     benzonatate  (TESSALON ) 100 MG capsule Take 1 capsule (100 mg total) by mouth every 8 (eight) hours as needed for cough. 21 capsule 0   buPROPion (WELLBUTRIN XL) 150 MG 24  hr tablet Take 1 tablet by mouth daily.     cyclobenzaprine (FLEXERIL) 10 MG tablet Take 1 tablet by mouth at bedtime.     escitalopram (LEXAPRO) 20 MG tablet Take 1 tablet by mouth daily.     famotidine  (PEPCID ) 20 MG tablet Take 1 tablet (20 mg total) by mouth 2 (two) times daily. 30 tablet 0   fluconazole  (DIFLUCAN ) 150 MG tablet Take 1 tablet (150 mg total) by mouth daily. 1 tablet 0   Lactobacillus Rhamnosus, GG, (RA PROBIOTIC DIGESTIVE  CARE) CAPS Take 1 capsule by mouth daily.     Multiple Vitamins-Minerals (MULTIVITAL PO) Take by mouth.     omeprazole  (PRILOSEC) 40 MG capsule OPEN 1 CAPSULE AND SPRINKLE INTO APPLESAUCE EVERY MORNING. 90 capsule 2   predniSONE  (DELTASONE ) 20 MG tablet Take 2 tablets (40 mg total) by mouth daily with breakfast. 10 tablet 0   predniSONE  (DELTASONE ) 20 MG tablet Take 2 tablets (40 mg total) by mouth daily with breakfast. For the next four days 8 tablet 0   promethazine -dextromethorphan (PROMETHAZINE -DM) 6.25-15 MG/5ML syrup Take 5 mLs by mouth 3 (three) times daily as needed for cough. 200 mL 0   traMADol (ULTRAM) 50 MG tablet Take 50 mg by mouth 2 (two) times daily as needed.     No current facility-administered medications for this visit.    Allergies as of 09/06/2024 - Review Complete 05/12/2024  Allergen Reaction Noted   Latex Shortness Of Breath, Itching, Swelling, and Rash 03/24/2018   Penicillins Anaphylaxis, Shortness Of Breath, Swelling, and Rash 11/21/2006   Hydrocodone-acetaminophen Swelling 11/24/2006   Methocarbamol Itching and Rash 02/21/2020   Methylprednisolone Hives, Itching, and Rash 04/12/2020   Oxycodone-acetaminophen Itching, Rash, and Hives 11/25/2020    Family History  Problem Relation Age of Onset   Colon polyps Mother    Asthma Mother    Hypertension Mother    Hypercholesterolemia Mother    Esophageal cancer Father    Other Father        Malignant Tumor of Lung   Cancer - Other Father        Pharangeal carcinoma   Colon cancer Neg Hx    Rectal cancer Neg Hx    Stomach cancer Neg Hx     Social History   Tobacco Use   Smoking status: Never   Smokeless tobacco: Never  Vaping Use   Vaping status: Some Days  Substance Use Topics   Alcohol use: Yes    Comment: Social   Drug use: Never     Review of Systems:    Constitutional: No weight loss, fever, chills, weakness or fatigue Eyes: No change in vision Ears, Nose, Throat:  No change in hearing or  congestion Skin: No rash or itching Cardiovascular: No chest pain, chest pressure or palpitations   Respiratory: No SOB or cough Gastrointestinal: See HPI and otherwise negative Genitourinary: No dysuria or change in urinary frequency Neurological: No headache, dizziness or syncope Musculoskeletal: No new muscle or joint pain Hematologic: No bleeding or bruising    Physical Exam:  Vital signs: There were no vitals taken for this visit.  Constitutional: NAD, Well developed, Well nourished, alert and cooperative Head:  Normocephalic and atraumatic.  Eyes: No scleral icterus. Conjunctiva pink. Mouth: No oral lesions. Respiratory: Respirations even and unlabored. Lungs clear to auscultation bilaterally.  No wheezes, crackles, or rhonchi.  Cardiovascular:  Regular rate and rhythm. No murmurs. No peripheral edema. Gastrointestinal:  Soft, nondistended, nontender. No rebound or guarding. Normal bowel sounds.  No appreciable masses or hepatomegaly. Rectal:  Not performed.  Neurologic:  Alert and oriented x4;  grossly normal neurologically.  Skin:   Dry and intact without significant lesions or rashes. Psychiatric: Oriented to person, place and time. Demonstrates good judgement and reason without abnormal affect or behaviors.   RELEVANT LABS AND IMAGING: CBC    Component Value Date/Time   WBC 8.3 05/12/2024 1247   RBC 4.54 05/12/2024 1247   HGB 14.1 05/12/2024 1247   HCT 41.7 05/12/2024 1247   PLT 365 05/12/2024 1247   MCV 91.9 05/12/2024 1247   MCH 31.1 05/12/2024 1247   MCHC 33.8 05/12/2024 1247   RDW 13.4 05/12/2024 1247   LYMPHSABS 6.4 (H) 03/08/2022 1930   MONOABS 0.2 03/08/2022 1930   EOSABS 1.2 (H) 03/08/2022 1930   BASOSABS 0.0 03/08/2022 1930    CMP     Component Value Date/Time   NA 139 05/12/2024 1247   K 4.3 05/12/2024 1247   CL 106 05/12/2024 1247   CO2 23 05/12/2024 1247   GLUCOSE 90 05/12/2024 1247   BUN 7 05/12/2024 1247   CREATININE 0.85 05/12/2024 1247    CALCIUM 9.1 05/12/2024 1247   PROT 6.0 (L) 03/08/2022 1930   ALBUMIN 3.3 (L) 03/08/2022 1930   AST 18 03/08/2022 1930   ALT 37 03/08/2022 1930   ALKPHOS 75 03/08/2022 1930   BILITOT 0.3 03/08/2022 1930   GFRNONAA >60 05/12/2024 1247     Assessment/Plan:     Patient assigned to Dr. San Camie Furbish, PA-C [facility] 09/05/2024, 4:41 PM  Patient Care Team: Winfred Pilsner, PA-C as PCP - General (Physician Assistant)

## 2024-09-06 ENCOUNTER — Other Ambulatory Visit (INDEPENDENT_AMBULATORY_CARE_PROVIDER_SITE_OTHER)

## 2024-09-06 ENCOUNTER — Encounter: Payer: Self-pay | Admitting: Gastroenterology

## 2024-09-06 ENCOUNTER — Ambulatory Visit: Admitting: Gastroenterology

## 2024-09-06 DIAGNOSIS — Z860101 Personal history of adenomatous and serrated colon polyps: Secondary | ICD-10-CM

## 2024-09-06 DIAGNOSIS — K219 Gastro-esophageal reflux disease without esophagitis: Secondary | ICD-10-CM

## 2024-09-06 DIAGNOSIS — R159 Full incontinence of feces: Secondary | ICD-10-CM

## 2024-09-06 DIAGNOSIS — R141 Gas pain: Secondary | ICD-10-CM

## 2024-09-06 DIAGNOSIS — K5900 Constipation, unspecified: Secondary | ICD-10-CM

## 2024-09-06 DIAGNOSIS — R1319 Other dysphagia: Secondary | ICD-10-CM

## 2024-09-06 DIAGNOSIS — K5909 Other constipation: Secondary | ICD-10-CM

## 2024-09-06 DIAGNOSIS — M549 Dorsalgia, unspecified: Secondary | ICD-10-CM

## 2024-09-06 DIAGNOSIS — Z9884 Bariatric surgery status: Secondary | ICD-10-CM | POA: Diagnosis not present

## 2024-09-06 DIAGNOSIS — G8929 Other chronic pain: Secondary | ICD-10-CM

## 2024-09-06 DIAGNOSIS — R1310 Dysphagia, unspecified: Secondary | ICD-10-CM

## 2024-09-06 LAB — COMPREHENSIVE METABOLIC PANEL WITH GFR
ALT: 20 U/L (ref 0–35)
AST: 18 U/L (ref 0–37)
Albumin: 4.1 g/dL (ref 3.5–5.2)
Alkaline Phosphatase: 77 U/L (ref 39–117)
BUN: 8 mg/dL (ref 6–23)
CO2: 27 meq/L (ref 19–32)
Calcium: 8.6 mg/dL (ref 8.4–10.5)
Chloride: 104 meq/L (ref 96–112)
Creatinine, Ser: 0.79 mg/dL (ref 0.40–1.20)
GFR: 87.9 mL/min (ref >60.00)
Glucose, Bld: 83 mg/dL (ref 70–99)
Potassium: 4 meq/L (ref 3.5–5.1)
Sodium: 139 meq/L (ref 135–145)
Total Bilirubin: 0.3 mg/dL (ref 0.2–1.2)
Total Protein: 7.1 g/dL (ref 6.0–8.3)

## 2024-09-06 LAB — CBC WITH DIFFERENTIAL/PLATELET
Basophils Absolute: 0 K/uL (ref 0.0–0.1)
Basophils Relative: 0.6 % (ref 0.0–3.0)
Eosinophils Absolute: 0.3 K/uL (ref 0.0–0.7)
Eosinophils Relative: 3.7 % (ref 0.0–5.0)
HCT: 40.3 % (ref 36.0–46.0)
Hemoglobin: 13.3 g/dL (ref 12.0–15.0)
Lymphocytes Relative: 35.6 % (ref 12.0–46.0)
Lymphs Abs: 2.5 K/uL (ref 0.7–4.0)
MCHC: 33 g/dL (ref 30.0–36.0)
MCV: 91.6 fl (ref 78.0–100.0)
Monocytes Absolute: 0.4 K/uL (ref 0.1–1.0)
Monocytes Relative: 5 % (ref 3.0–12.0)
Neutro Abs: 3.9 K/uL (ref 1.4–7.7)
Neutrophils Relative %: 55.1 % (ref 43.0–77.0)
Platelets: 339 K/uL (ref 150.0–400.0)
RBC: 4.4 Mil/uL (ref 3.87–5.11)
RDW: 14.2 % (ref 11.5–15.5)
WBC: 7.1 K/uL (ref 4.0–10.5)

## 2024-09-06 LAB — TSH: TSH: 1.4 u[IU]/mL (ref 0.35–5.50)

## 2024-09-06 MED ORDER — OMEPRAZOLE 40 MG PO CPDR: 40.0000 mg | DELAYED_RELEASE_CAPSULE | Freq: Two times a day (BID) | ORAL | 3 refills | Status: DC

## 2024-09-06 MED ORDER — LINACLOTIDE 290 MCG PO CAPS: 290.0000 ug | ORAL_CAPSULE | Freq: Every day | ORAL | 3 refills | Status: DC

## 2024-09-06 NOTE — Patient Instructions (Addendum)
 Your provider has requested that you go to the basement level for lab work before leaving today. Press B on the elevator. The lab is located at the first door on the left as you exit the elevator.  You have been scheduled for an endoscopy. Please follow written instructions given to you at your visit today.  If you use inhalers (even only as needed), please bring them with you on the day of your procedure.  If you take any of the following medications, they will need to be adjusted prior to your procedure:   DO NOT TAKE 7 DAYS PRIOR TO TEST- Trulicity (dulaglutide) Ozempic, Wegovy (semaglutide) Mounjaro (tirzepatide) Bydureon Bcise (exanatide extended release)  DO NOT TAKE 1 DAY PRIOR TO YOUR TEST Rybelsus (semaglutide) Adlyxin (lixisenatide) Victoza (liraglutide) Byetta (exanatide) ___________________________________________________________________________  We have sent the following medications to your pharmacy for you to pick up at your convenience: Linzess  290 mcg  Omeprazole  40 mg twice daily  Due to recent changes in healthcare laws, you may see the results of your imaging and laboratory studies on MyChart before your provider has had a chance to review them.  We understand that in some cases there may be results that are confusing or concerning to you. Not all laboratory results come back in the same time frame and the provider may be waiting for multiple results in order to interpret others.  Please give us  48 hours in order for your provider to thoroughly review all the results before contacting the office for clarification of your results.    I appreciate the  opportunity to care for you  Thank You   Camie Heinz,PA-C

## 2024-09-07 LAB — IGA: Immunoglobulin A: 395 mg/dL — ABNORMAL HIGH (ref 47–310)

## 2024-09-07 LAB — TISSUE TRANSGLUTAMINASE, IGA: (tTG) Ab, IgA: <1 U/mL

## 2024-09-08 ENCOUNTER — Ambulatory Visit: Payer: Self-pay | Admitting: Gastroenterology

## 2024-10-05 ENCOUNTER — Ambulatory Visit (AMBULATORY_SURGERY_CENTER): Admitting: Gastroenterology

## 2024-10-05 ENCOUNTER — Encounter: Payer: Self-pay | Admitting: Gastroenterology

## 2024-10-05 VITALS — BP 144/92 | HR 72 | Temp 97.8°F | Resp 11

## 2024-10-05 DIAGNOSIS — R1310 Dysphagia, unspecified: Secondary | ICD-10-CM | POA: Diagnosis not present

## 2024-10-05 DIAGNOSIS — K222 Esophageal obstruction: Secondary | ICD-10-CM

## 2024-10-05 DIAGNOSIS — K3189 Other diseases of stomach and duodenum: Secondary | ICD-10-CM | POA: Diagnosis not present

## 2024-10-05 DIAGNOSIS — K219 Gastro-esophageal reflux disease without esophagitis: Secondary | ICD-10-CM

## 2024-10-05 DIAGNOSIS — Q399 Congenital malformation of esophagus, unspecified: Secondary | ICD-10-CM

## 2024-10-05 DIAGNOSIS — Z9884 Bariatric surgery status: Secondary | ICD-10-CM | POA: Diagnosis not present

## 2024-10-05 DIAGNOSIS — K319 Disease of stomach and duodenum, unspecified: Secondary | ICD-10-CM | POA: Diagnosis not present

## 2024-10-05 DIAGNOSIS — K2970 Gastritis, unspecified, without bleeding: Secondary | ICD-10-CM

## 2024-10-05 DIAGNOSIS — R1319 Other dysphagia: Secondary | ICD-10-CM

## 2024-10-05 MED ORDER — PANTOPRAZOLE SODIUM 40 MG PO TBEC: 40.0000 mg | DELAYED_RELEASE_TABLET | Freq: Every day | ORAL | 3 refills | Status: DC

## 2024-10-05 MED ORDER — SODIUM CHLORIDE 0.9 % IV SOLN: 500.0000 mL | Freq: Once | INTRAVENOUS | Status: DC

## 2024-10-05 MED ORDER — PANTOPRAZOLE SODIUM 40 MG PO TBEC: 40.0000 mg | DELAYED_RELEASE_TABLET | Freq: Two times a day (BID) | ORAL | 3 refills | Status: AC

## 2024-10-05 NOTE — Progress Notes (Signed)
1012 Robinul 0.1 mg IV given due large amount of secretions upon assessment.  MD made aware, vss

## 2024-10-05 NOTE — Progress Notes (Signed)
 Called to room to assist during endoscopic procedure.  Patient ID and intended procedure confirmed with present staff. Received instructions for my participation in the procedure from the performing physician.

## 2024-10-05 NOTE — Patient Instructions (Addendum)
 Resume previous diet.  Continue present medications.  Await pathology results.   Stop Omeprazole . Pick up prescription for Protonix 40mg  at pharmacy and take as prescribed.   YOU HAD AN ENDOSCOPIC PROCEDURE TODAY AT THE [facility]:   Refer to the procedure report that was given to you for any specific questions about what was found during the examination.  If the procedure report does not answer your questions, please call your gastroenterologist to clarify.  If you requested that your care partner not be given the details of your procedure findings, then the procedure report has been included in a sealed envelope for you to review at your convenience later.  YOU SHOULD EXPECT: Some feelings of bloating in the abdomen. Passage of more gas than usual.  Walking can help get rid of the air that was put into your GI tract during the procedure and reduce the bloating. If you had a lower endoscopy (such as a colonoscopy or flexible sigmoidoscopy) you may notice spotting of blood in your stool or on the toilet paper. If you underwent a bowel prep for your procedure, you may not have a normal bowel movement for a few days.  Please Note:  You might notice some irritation and congestion in your nose or some drainage.  This is from the oxygen used during your procedure.  There is no need for concern and it should clear up in a day or so.  SYMPTOMS TO REPORT IMMEDIATELY:  Following upper endoscopy (EGD)  Vomiting of blood or coffee ground material  New chest pain or pain under the shoulder blades  Painful or persistently difficult swallowing  New shortness of breath  Fever of 100F or higher  Black, tarry-looking stools  For urgent or emergent issues, a gastroenterologist can be reached at any hour by calling [phone]. Do not use MyChart messaging for urgent concerns.    DIET:  We do recommend a small meal at first, but then you may proceed to your regular diet.  Drink plenty of  fluids but you should avoid alcoholic beverages for 24 hours.  ACTIVITY:  You should plan to take it easy for the rest of today and you should NOT DRIVE or use heavy machinery until tomorrow (because of the sedation medicines used during the test).    FOLLOW UP: Our staff will call the number listed on your records the next business day following your procedure.  We will call around 7:15- 8:00 am to check on you and address any questions or concerns that you may have regarding the information given to you following your procedure. If we do not reach you, we will leave a message.     If any biopsies were taken you will be contacted by phone or by letter within the next 1-3 weeks.  Please call us  at [phone] if you have not heard about the biopsies in 3 weeks.    SIGNATURES/CONFIDENTIALITY: You and/or your care partner have signed paperwork which will be entered into your electronic medical record.  These signatures attest to the fact that that the information above on your After Visit Summary has been reviewed and is understood.  Full responsibility of the confidentiality of this discharge information lies with you and/or your care-partner.

## 2024-10-05 NOTE — Progress Notes (Signed)
 Pt's states no medical or surgical changes since previsit or office visit.

## 2024-10-05 NOTE — Progress Notes (Signed)
 Report given to PACU, vss

## 2024-10-05 NOTE — Op Note (Signed)
 [facility] Patient Name: Makaylen Colon Jann Procedure Date: 10/05/2024 10:10 AM MRN: [id number] Endoscopist: Sandor Flatter , MD, [id number] Age: 48 Referring MD:  Date of Birth: Feb 16, 1976 Gender: Female Account #: [id number] Procedure:                Upper GI endoscopy Indications:              Dysphagia, Heartburn, Suspected esophageal reflux,                            Regurgitation Medicines:                Monitored Anesthesia Care Procedure:                Pre-Anesthesia Assessment:                           - Prior to the procedure, a History and Physical                            was performed, and patient medications and                            allergies were reviewed. The patient's tolerance of                            previous anesthesia was also reviewed. The risks                            and benefits of the procedure and the sedation                            options and risks were discussed with the patient.                            All questions were answered, and informed consent                            was obtained. Prior Anticoagulants: The patient has                            taken no anticoagulant or antiplatelet agents. ASA                            Grade Assessment: II - A patient with mild systemic                            disease. After reviewing the risks and benefits,                            the patient was deemed in satisfactory condition to                            undergo the procedure.  After obtaining informed consent, the endoscope was                            passed under direct vision. Throughout the                            procedure, the patient's blood pressure, pulse, and                            oxygen saturations were monitored continuously. The                            Olympus scope [id number] was introduced through the                            mouth, and advanced to the  third part of duodenum.                            The upper GI endoscopy was accomplished without                            difficulty. The patient tolerated the procedure                            well. Scope In: Scope Out: Findings:                 The lower third of the esophagus was mildly                            tortuous. This was easily traversed.                           The Z-line was regular and was found 33 cm from the                            incisors.                           One very subtle, benign-appearing, intrinsic,                            non-obstructing stricture was found in the lower                            third of the esophagus. This stenosis measured less                            than one cm (in length). The stenosis was                            traversed. A TTS dilator was passed through the                            scope. Dilation with a  15-16.5-18 mm balloon                            dilator was performed to 18 mm. The dilation site                            was examined and showed no bleeding, mucosal tear                            or perforation. The inflated balloon was then                            dragged proximally through the esophagus for                            identification and treatment of other proximal                            strictures. The endoscope was reintroduced and no                            mucosal disruptions noted. Estimated blood loss:                            none.                           Evidence of a sleeve gastrectomy was found in the                            gastric fundus and in the gastric body. This was                            characterized by healthy appearing mucosa.                           Mildly erythematous mucosa without bleeding was                            found in the entire examined stomach. Biopsies were                            taken with a cold forceps for  histology. Estimated                            blood loss was minimal. There was bile noted from                            the pylorus.                           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 This was characterized by healthy                            appearing mucosa.                           The visualized duodenal mucosa was otherwise normal. Complications:            No immediate complications. Estimated Blood Loss:     Estimated blood loss was minimal. Impression:               - Mildly tortuous lower esophagus.                           - Z-line regular, 33 cm from the incisors.                           - Subtle, benign-appearing esophageal stricture.                            Dilated with 18 mm TTS balloon.                           - A sleeve gastrectomy was found, characterized by                            healthy appearing mucosa.                           - Erythematous mucosa in the stomach. Biopsied.                           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                           - Normal mucosa was found in the entire examined                            duodenum. Recommendation:           - Patient has a contact number available for                            emergencies. The signs and symptoms of potential                            delayed complications were discussed with the                            patient. Return to normal activities tomorrow.                            Written discharge instructions were provided to the  patient.                           - Resume previous diet.                           - Continue present medications.                           - Await pathology results.                           - Stop omeprazole  and trial Protonix (pantoprazole)                            40 mg PO BID  for diagnostic and therapeutic intent.                           - If no high-dose Protonix, plan for esophageal                            manometry and pH/impedance testing.                           - Bile noted coming from the pylorus. Given the                            lack of response to acid suppression medications,                            querry bile acid reflux.                           - Return to GI clinic at appointment to be                            scheduled. Sandor Flatter, MD 10/05/2024 10:47:09 AM

## 2024-10-05 NOTE — Progress Notes (Signed)
 GASTROENTEROLOGY PROCEDURE H&P NOTE   Primary Care Physician: Winfred Pilsner, PA-C    Reason for Procedure:   GERD, dysphagia, belching, bloating  Plan:    EGD with possible dilation and/or biopsies  Patient is appropriate for endoscopic procedure(s) in the ambulatory ([facility]) setting.  The nature of the procedure, as well as the risks, benefits, and alternatives were carefully and thoroughly reviewed with the patient. Ample time for discussion and questions allowed. The patient understood, was satisfied, and agreed to proceed.     HPI: Rhonda Guerrero is a 48 y.o. female who presents for EGD for evaluation of GERD and dysphagia along with belching and bloating.  History of duodenal switch in 2022.  Having breakthrough reflux symptoms despite Prilosec 40 mg daily and Pepcid  for breakthrough (takes daily for breakthrough).  Was seen in the GI clinic on 09/06/2022 increased Prilosec to 40 mg twice daily w/o appreciable improvement in reflux sxs.  Otherwise no significant change in clinical history since that appointment.  Past Medical History:  Diagnosis Date   Asthma    Blood transfusion without reported diagnosis    With bone surgery in 2010   GERD (gastroesophageal reflux disease)    Hypercholesterolemia    Insomnia    Mixed anxiety and depressive disorder     Past Surgical History:  Procedure Laterality Date   ABDOMINAL HYSTERECTOMY     BARIATRIC SURGERY     Gastric sleeve and duodenal switch   CESAREAN SECTION     HIP RESECTION ARTHROPLASTY     left   SPINE SURGERY     TUBAL LIGATION     WISDOM TOOTH EXTRACTION      Prior to Admission medications   Medication Sig Start Date End Date Taking? Authorizing Provider  albuterol  (VENTOLIN  HFA) 108 (90 Base) MCG/ACT inhaler Inhale 2 puffs into the lungs 4 (four) times daily as needed. 05/12/24  Yes Garrick Charleston, MD  CHLOROPHYLL PO  05/28/24  Yes [provider]  cyclobenzaprine (FLEXERIL) 10 MG tablet Take  1 tablet by mouth at bedtime. 10/12/19  Yes [provider]  DULoxetine HCl 40 MG CPEP Take 1 capsule by mouth 2 (two) times daily.   Yes [provider]  escitalopram (LEXAPRO) 20 MG tablet Take 1 tablet by mouth daily. 10/01/14  Yes [provider]  famotidine  (PEPCID ) 20 MG tablet Take 1 tablet (20 mg total) by mouth 2 (two) times daily. 01/08/23  Yes Christopher Savannah, PA-C  hydrOXYzine (ATARAX) 25 MG tablet Take 25 mg by mouth. As needed   Yes [provider]  Lactobacillus Rhamnosus, GG, (RA PROBIOTIC DIGESTIVE CARE) CAPS Take 1 capsule by mouth daily.   Yes [provider]  linaclotide  (LINZESS ) 290 MCG CAPS capsule Take 1 capsule (290 mcg total) by mouth daily before breakfast. 09/06/24  Yes Heinz, Sara E, PA-C  Misc Natural Products (TURMERIC, CURCUMIN, PO) Take by mouth. 03/28/24  Yes [provider]  Multiple Vitamins-Minerals (MULTIVITAL PO) Take by mouth.   Yes [provider]  omeprazole  (PRILOSEC) 40 MG capsule Take 1 capsule (40 mg total) by mouth in the morning and at bedtime. 09/06/24  Yes Heinz, Sara E, PA-C  traZODone (DESYREL) 100 MG tablet Take 100 mg by mouth. 07/29/23  Yes [provider]  diphenhydrAMINE (BENADRYL) 25 mg capsule Take 25 mg by mouth.    [provider]  Docusate Sodium (DSS) 100 MG CAPS TAKE 1 CAPSULE EVERY DAY BY ORAL ROUTE AS DIRECTED, FOR CONSTIPATION.  [provider]  ipratropium (ATROVENT) 0.06 % nasal spray Place 2 sprays into the nose. As needed    [provider]  pimecrolimus (ELIDEL) 1 % cream As needed    [provider]  prazosin (MINIPRESS) 2 MG capsule Take 2 mg by mouth at bedtime. 4mg     [provider]  pregabalin (LYRICA) 150 MG capsule Take 150 mg by mouth 3 (three) times daily.    [provider]    Current Outpatient Medications  Medication Sig Dispense Refill   albuterol  (VENTOLIN  HFA) 108 (90 Base) MCG/ACT inhaler  Inhale 2 puffs into the lungs 4 (four) times daily as needed. 1 each 0   CHLOROPHYLL PO      cyclobenzaprine (FLEXERIL) 10 MG tablet Take 1 tablet by mouth at bedtime.     DULoxetine HCl 40 MG CPEP Take 1 capsule by mouth 2 (two) times daily.     escitalopram (LEXAPRO) 20 MG tablet Take 1 tablet by mouth daily.     famotidine  (PEPCID ) 20 MG tablet Take 1 tablet (20 mg total) by mouth 2 (two) times daily. 30 tablet 0   hydrOXYzine (ATARAX) 25 MG tablet Take 25 mg by mouth. As needed     Lactobacillus Rhamnosus, GG, (RA PROBIOTIC DIGESTIVE CARE) CAPS Take 1 capsule by mouth daily.     linaclotide  (LINZESS ) 290 MCG CAPS capsule Take 1 capsule (290 mcg total) by mouth daily before breakfast. 30 capsule 3   Misc Natural Products (TURMERIC, CURCUMIN, PO) Take by mouth.     Multiple Vitamins-Minerals (MULTIVITAL PO) Take by mouth.     omeprazole  (PRILOSEC) 40 MG capsule Take 1 capsule (40 mg total) by mouth in the morning and at bedtime. 60 capsule 3   traZODone (DESYREL) 100 MG tablet Take 100 mg by mouth.     diphenhydrAMINE (BENADRYL) 25 mg capsule Take 25 mg by mouth.     Docusate Sodium (DSS) 100 MG CAPS TAKE 1 CAPSULE EVERY DAY BY ORAL ROUTE AS DIRECTED, FOR CONSTIPATION.     ipratropium (ATROVENT) 0.06 % nasal spray Place 2 sprays into the nose. As needed     pimecrolimus (ELIDEL) 1 % cream As needed     prazosin (MINIPRESS) 2 MG capsule Take 2 mg by mouth at bedtime. 4mg      pregabalin (LYRICA) 150 MG capsule Take 150 mg by mouth 3 (three) times daily.     Current Facility-Administered Medications  Medication Dose Route Frequency Provider Last Rate Last Admin   0.9 %  sodium chloride  infusion  500 mL Intravenous Once Chanita Boden V, DO        Allergies as of 10/05/2024 - Review Complete 10/05/2024  Allergen Reaction Noted   Latex Shortness Of Breath, Itching, Swelling, and Rash 03/24/2018   Penicillins Anaphylaxis, Shortness Of Breath, Swelling, and Rash 11/21/2006    Hydrocodone-acetaminophen Swelling 11/24/2006   Methocarbamol Itching and Rash 02/21/2020   Methylprednisolone Hives, Itching, and Rash 04/12/2020   Nsaids Other (See Comments) 03/18/2023   Oxycodone-acetaminophen Itching, Rash, and Hives 11/25/2020   Wound dressings Dermatitis 05/01/2020    Family History  Problem Relation Age of Onset   Colon polyps Mother    Asthma Mother    Hypertension Mother    Hypercholesterolemia Mother    Esophageal cancer Father    Other Father        Malignant Tumor of Lung   Cancer - Other Father        Pharangeal carcinoma   Colon cancer Neg  Hx    Rectal cancer Neg Hx    Stomach cancer Neg Hx     Social History   Socioeconomic History   Marital status: Single    Spouse name: Not on file   Number of children: Not on file   Years of education: Not on file   Highest education level: Not on file  Occupational History   Not on file  Tobacco Use   Smoking status: Never   Smokeless tobacco: Never  Vaping Use   Vaping status: Some Days  Substance and Sexual Activity   Alcohol use: Yes    Comment: Social   Drug use: Never   Sexual activity: Not on file  Other Topics Concern   Not on file  Social History Narrative   Not on file   Social Drivers of Health   Financial Resource Strain: Not on file  Food Insecurity: No Food Insecurity (06/08/2023)   Received from [facility]   Hunger Vital Sign    Within the past 12 months, you worried that your food would run out before you got the money to buy more.: Never true    Within the past 12 months, the food you bought just didn't last and you didn't have money to get more.: Never true  Transportation Needs: No Transportation Needs (06/08/2023)   Received from [facility]   PRAPARE - Administrator, Civil Service (Medical): No    Lack of Transportation (Non-Medical): No  Physical Activity: Not on file  Stress: Not on file  Social Connections:  Feeling Socially Integrated (04/19/2023)   Received from [facility]   OASIS D0700: Social Isolation    Frequency of experiencing loneliness or isolation: Never  Intimate Partner Violence: Unknown (03/20/2023)   Received from [facility]   Abuse Indicators    Interpersonal Safety: Not on file    Physical Exam: Vital signs in last 24 hours: @BP  125/80   Pulse 69   Temp 97.8 F (36.6 C) (Temporal)   Resp 18   SpO2 100%  GEN: NAD EYE: Sclerae anicteric ENT: MMM CV: Non-tachycardic Pulm: CTA b/l GI: Soft, NT/ND NEURO:  Alert & Oriented x 3   Sandor Flatter, DO [facility]   10/05/2024 10:13 AM

## 2024-10-05 NOTE — Progress Notes (Signed)
 Agree with the assessment and plan as outlined by Valiant Gaul, PA-C.  Mollee Neer, DO, [facility]

## 2024-10-06 ENCOUNTER — Telehealth: Payer: Self-pay

## 2024-10-06 NOTE — Telephone Encounter (Signed)
  Follow up Call-     10/05/2024    9:08 AM  Call back number  Post procedure Call Back phone  # [phone]  Permission to leave phone message Yes     Patient questions:  Do you have a fever, pain , or abdominal swelling? No. Pain Score  0 *  Have you tolerated food without any problems? Yes.    Have you been able to return to your normal activities? Yes.    Do you have any questions about your discharge instructions: Diet   No. Medications  No. Follow up visit  No.  Do you have questions or concerns about your Care? No.  Actions: * If pain score is 4 or above: No action needed, pain <4.

## 2024-10-09 ENCOUNTER — Ambulatory Visit: Payer: Self-pay | Admitting: Gastroenterology

## 2024-10-09 LAB — SURGICAL PATHOLOGY

## 2024-11-02 NOTE — Progress Notes (Deleted)
 11/02/2024 Rhonda Guerrero [id number] [birth year]  Referring provider: Winfred Pilsner, PA-C Primary GI doctor: Dr. San  ASSESSMENT AND PLAN:  GERD With dysphagia history of gastric sleeve and duodenal switch 03/16/2022 CTAP small esophageal hiatal hernia, postoperative gastric sleeve and hysterectomy otherwise unremarkable 10/05/2024 EGD mildly torturous lower esophagus benign-appearing soft stricture dilated 18 mm, sleeve gastrectomy healthy up mucosa erythematous meat Khosa duodenal switch characterized by healthy-appearing mucosa normal mucosa duodenum pathology showed negative H. pylori negative metaplasia  Opioid-induced constipation, gas, fecal incontinence with history of hysterectomy  Personal history of colon polyps 11/16/2021 colonoscopy Dr. Legrand diminutive TA polyp hepatic flexure diverticulosis left and right colon otherwise unremarkable recall 7 years  Patient Care Team: Winfred Pilsner, PA-C as PCP - General (Physician Assistant)  HISTORY OF PRESENT ILLNESS: 48 y.o. female with a past medical history listed below presents for evaluation of ***.   *** Discussed the use of AI scribe software for clinical note transcription with the patient, who gave verbal consent to proceed.  History of Present Illness            She  reports that she has never smoked. She has never used smokeless tobacco. She reports current alcohol use. She reports that she does not use drugs.  RELEVANT GI HISTORY, IMAGING AND LABS: Results         CT A/P 03/08/2022 1. No acute process demonstrated in the abdomen or pelvis. No evidence of bowel obstruction or inflammation. Appendix is normal. 2. Small esophageal hiatal hernia. 3. Postoperative gastric sleeve and hysterectomy. 4. Degenerative changes and postoperative changes in the lumbar spine.   Colonoscopy 10/29/2021 (done by Dr. Legrand) - One diminutive polyp at the hepatic flexure, removed with a cold snare.  Resected and retrieved.  - Diverticulosis in the left colon and in the right colon.  - The examination was otherwise normal on direct and retroflexion views. 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   CT A/P 03/21/2021 1. No acute CT findings of the abdomen or pelvis to explain lower abdominal pain. 2. Status post Roux type gastric bypass. 3. Sigmoid diverticulosis without evidence of acute diverticulitis. 4. Status post hysterectomy.   CBC    Component Value Date/Time   WBC 7.1 09/06/2024 0946   RBC 4.40 09/06/2024 0946   HGB 13.3 09/06/2024 0946   HCT 40.3 09/06/2024 0946   PLT 339.0 09/06/2024 0946   MCV 91.6 09/06/2024 0946   MCH 31.1 05/12/2024 1247   MCHC 33.0 09/06/2024 0946   RDW 14.2 09/06/2024 0946   LYMPHSABS 2.5 09/06/2024 0946   MONOABS 0.4 09/06/2024 0946   EOSABS 0.3 09/06/2024 0946   BASOSABS 0.0 09/06/2024 0946   Recent Labs    05/12/24 1247 09/06/24 0946  HGB 14.1 13.3    CMP     Component Value Date/Time   NA 139 09/06/2024 0946   K 4.0 09/06/2024 0946   CL 104 09/06/2024 0946   CO2 27 09/06/2024 0946   GLUCOSE 83 09/06/2024 0946   BUN 8 09/06/2024 0946   CREATININE 0.79 09/06/2024 0946   CALCIUM 8.6 09/06/2024 0946   PROT 7.1 09/06/2024 0946   ALBUMIN 4.1 09/06/2024 0946   AST 18 09/06/2024 0946   ALT 20 09/06/2024 0946   ALKPHOS 77 09/06/2024 0946   BILITOT 0.3 09/06/2024 0946   GFRNONAA >60 05/12/2024 1247      Latest Ref Rng & Units 09/06/2024  9:46 AM 03/08/2022    7:30 PM 03/12/2021    4:31 PM  Hepatic Function  Total Protein 6.0 - 8.3 g/dL 7.1  6.0  7.7   Albumin 3.5 - 5.2 g/dL 4.1  3.3  4.5   AST 0 - 37 U/L 18  18  17    ALT 0 - 35 U/L 20  37  22   Alk Phosphatase 39 - 117 U/L 77  75  86   Total Bilirubin 0.2 - 1.2 mg/dL 0.3  0.3  0.4       Current Medications:    Current Outpatient Medications (Cardiovascular):    prazosin (MINIPRESS) 2 MG  capsule, Take 2 mg by mouth at bedtime. 4mg   Current Outpatient Medications (Respiratory):    albuterol  (VENTOLIN  HFA) 108 (90 Base) MCG/ACT inhaler, Inhale 2 puffs into the lungs 4 (four) times daily as needed.   diphenhydrAMINE (BENADRYL) 25 mg capsule, Take 25 mg by mouth.   ipratropium (ATROVENT) 0.06 % nasal spray, Place 2 sprays into the nose. As needed    Current Outpatient Medications (Other):    CHLOROPHYLL PO,    cyclobenzaprine (FLEXERIL) 10 MG tablet, Take 1 tablet by mouth at bedtime.   Docusate Sodium (DSS) 100 MG CAPS, TAKE 1 CAPSULE EVERY DAY BY ORAL ROUTE AS DIRECTED, FOR CONSTIPATION.   DULoxetine HCl 40 MG CPEP, Take 1 capsule by mouth 2 (two) times daily.   escitalopram (LEXAPRO) 20 MG tablet, Take 1 tablet by mouth daily.   famotidine  (PEPCID ) 20 MG tablet, Take 1 tablet (20 mg total) by mouth 2 (two) times daily.   hydrOXYzine (ATARAX) 25 MG tablet, Take 25 mg by mouth. As needed   Lactobacillus Rhamnosus, GG, (RA PROBIOTIC DIGESTIVE CARE) CAPS, Take 1 capsule by mouth daily.   linaclotide  (LINZESS ) 290 MCG CAPS capsule, Take 1 capsule (290 mcg total) by mouth daily before breakfast.   Misc Natural Products (TURMERIC, CURCUMIN, PO), Take by mouth.   Multiple Vitamins-Minerals (MULTIVITAL PO), Take by mouth.   pantoprazole (PROTONIX) 40 MG tablet, Take 1 tablet (40 mg total) by mouth 2 (two) times daily.   pimecrolimus (ELIDEL) 1 % cream, As needed   pregabalin (LYRICA) 150 MG capsule, Take 150 mg by mouth 3 (three) times daily.   traZODone (DESYREL) 100 MG tablet, Take 100 mg by mouth.  Medical History:  Past Medical History:  Diagnosis Date   Asthma    Blood transfusion without reported diagnosis    With bone surgery in 2010   GERD (gastroesophageal reflux disease)    Hypercholesterolemia    Insomnia    Mixed anxiety and depressive disorder    Allergies:  Allergies  Allergen Reactions   Latex Shortness Of Breath, Itching, Swelling and Rash    PATIENT  STATES ONLY ALLERGIC TO THE POWDER INSIDE LATEX GLOVES    Penicillins Anaphylaxis, Shortness Of Breath, Swelling and Rash    THROAT SWELLING    Hydrocodone-Acetaminophen Swelling    Other reaction(s): Other (See Comments), other/intolerance HEART RACING     Methocarbamol Itching and Rash   Methylprednisolone Hives, Itching and Rash   Nsaids Other (See Comments)    Non-steroidal anti-inflammatory agent (product)- Due to weight loss surgery per patient   Oxycodone-Acetaminophen Itching, Rash and Hives   Wound Dressings Dermatitis     Surgical History:  She  has a past surgical history that includes Bariatric Surgery; Cesarean section; Spine surgery; Abdominal hysterectomy; Tubal ligation; Wisdom tooth extraction; and Hip resection arthroplasty. Family History:  Her family  history includes Asthma in her mother; Cancer - Other in her father; Colon polyps in her mother; Esophageal cancer in her father; Hypercholesterolemia in her mother; Hypertension in her mother; Other in her father.  REVIEW OF SYSTEMS  : All other systems reviewed and negative except where noted in the History of Present Illness.  PHYSICAL EXAM: There were no vitals taken for this visit. Physical Exam          Alan JONELLE Coombs, PA-C 1:03 PM

## 2024-11-03 ENCOUNTER — Ambulatory Visit: Admitting: Physician Assistant

## 2024-12-19 NOTE — Progress Notes (Deleted)
 "    12/19/2024 Rhonda Guerrero [id number] 10-22-1976  Referring provider: Winfred Pilsner, PA-C Primary GI doctor: Dr. San  ASSESSMENT AND PLAN:  GERD with dysphagia, history of gastric sleeve and duodenal switch CT A/P 03/08/2022 similarly showed no acute process in the abdomen or pelvis, though there was finding of a small esophageal hiatal hernia  10/05/2024 EGD mildly torturous lower esophagus, subtle benign-appearing stricture status post dilatation 18 mm gastric sleeve with healthy mucosa erythematous mucosa duodenal switch healthy appearing mucosa normal, path negative H. 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 Recommended recall 7 years   Patient Care Team: Winfred Pilsner, PA-C as PCP - General (Physician Assistant)  HISTORY OF PRESENT ILLNESS: 48 y.o. female with a past medical history of asthma, GERD, high cholesterol, anxiety/depression, hysterectomy, C section, bariatric surgery (gastric sleeve and duodenal switch) and others listed below presents for evaluation of ***.   Patient last seen in the office 09/06/2024 by Lauraine Pa, PA for GERD, dysphagia and constipation. *** Discussed the use of AI scribe software for clinical note transcription with the patient, who gave verbal consent to proceed.  History of Present Illness            She  reports that she has never smoked. She has never used smokeless tobacco. She reports current alcohol use. She reports that she does not use drugs.  RELEVANT GI HISTORY, IMAGING AND LABS: Results         CT A/P 03/08/2022 1. No acute process demonstrated in the  abdomen or pelvis. No evidence of bowel obstruction or inflammation. Appendix is normal. 2. Small esophageal hiatal hernia. 3. Postoperative gastric sleeve and hysterectomy. 4. Degenerative changes and postoperative changes in the lumbar spine.   Colonoscopy 10/29/2021 (done by Dr. Legrand) - One diminutive polyp at the hepatic flexure, removed with a cold snare. Resected and retrieved.  - Diverticulosis in the left colon and in the right colon.  - The examination was otherwise normal on direct and retroflexion views. 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   CT A/P 03/21/2021 1. No acute CT findings of the abdomen or pelvis to explain lower abdominal pain. 2. Status post Roux type gastric bypass. 3. Sigmoid diverticulosis without evidence of acute diverticulitis. 4. Status post hysterectomy. CBC    Component Value Date/Time   WBC 7.1 09/06/2024 0946   RBC 4.40 09/06/2024 0946   HGB 13.3 09/06/2024 0946   HCT 40.3 09/06/2024 0946   PLT 339.0 09/06/2024 0946   MCV 91.6 09/06/2024 0946   MCH 31.1 05/12/2024 1247   MCHC 33.0 09/06/2024 0946   RDW 14.2 09/06/2024 0946   LYMPHSABS 2.5 09/06/2024 0946   MONOABS 0.4 09/06/2024 0946   EOSABS 0.3 09/06/2024 0946   BASOSABS 0.0 09/06/2024 0946   Recent Labs    05/12/24 1247 09/06/24 0946  HGB 14.1 13.3    CMP     Component Value Date/Time   NA 139 09/06/2024 0946   K 4.0 09/06/2024 0946   CL 104 09/06/2024 0946   CO2 27 09/06/2024 0946   GLUCOSE 83 09/06/2024 0946   BUN  8 09/06/2024 0946   CREATININE 0.79 09/06/2024 0946   CALCIUM 8.6 09/06/2024 0946   PROT 7.1 09/06/2024 0946   ALBUMIN 4.1 09/06/2024 0946   AST 18 09/06/2024 0946   ALT 20 09/06/2024 0946   ALKPHOS 77 09/06/2024 0946   BILITOT 0.3 09/06/2024 0946   GFRNONAA >60 05/12/2024 1247      Latest Ref Rng & Units 09/06/2024    9:46 AM 03/08/2022    7:30 PM 03/12/2021    4:31 PM   Hepatic Function  Total Protein 6.0 - 8.3 g/dL 7.1  6.0  7.7   Albumin 3.5 - 5.2 g/dL 4.1  3.3  4.5   AST 0 - 37 U/L 18  18  17    ALT 0 - 35 U/L 20  37  22   Alk Phosphatase 39 - 117 U/L 77  75  86   Total Bilirubin 0.2 - 1.2 mg/dL 0.3  0.3  0.4       Current Medications:   Current Outpatient Medications (Cardiovascular):    prazosin (MINIPRESS) 2 MG capsule, Take 2 mg by mouth at bedtime. 4mg   Current Outpatient Medications (Respiratory):    albuterol  (VENTOLIN  HFA) 108 (90 Base) MCG/ACT inhaler, Inhale 2 puffs into the lungs 4 (four) times daily as needed.   diphenhydrAMINE (BENADRYL) 25 mg capsule, Take 25 mg by mouth.   ipratropium (ATROVENT) 0.06 % nasal spray, Place 2 sprays into the nose. As needed  Current Outpatient Medications (Other):    CHLOROPHYLL PO,    cyclobenzaprine (FLEXERIL) 10 MG tablet, Take 1 tablet by mouth at bedtime.   Docusate Sodium (DSS) 100 MG CAPS, TAKE 1 CAPSULE EVERY DAY BY ORAL ROUTE AS DIRECTED, FOR CONSTIPATION.   DULoxetine HCl 40 MG CPEP, Take 1 capsule by mouth 2 (two) times daily.   escitalopram (LEXAPRO) 20 MG tablet, Take 1 tablet by mouth daily.   famotidine  (PEPCID ) 20 MG tablet, Take 1 tablet (20 mg total) by mouth 2 (two) times daily.   hydrOXYzine (ATARAX) 25 MG tablet, Take 25 mg by mouth. As needed   Lactobacillus Rhamnosus, GG, (RA PROBIOTIC DIGESTIVE CARE) CAPS, Take 1 capsule by mouth daily.   linaclotide  (LINZESS ) 290 MCG CAPS capsule, Take 1 capsule (290 mcg total) by mouth daily before breakfast.   Misc Natural Products (TURMERIC, CURCUMIN, PO), Take by mouth.   Multiple Vitamins-Minerals (MULTIVITAL PO), Take by mouth.   pantoprazole  (PROTONIX ) 40 MG tablet, Take 1 tablet (40 mg total) by mouth 2 (two) times daily.   pimecrolimus (ELIDEL) 1 % cream, As needed   pregabalin (LYRICA) 150 MG capsule, Take 150 mg by mouth 3 (three) times daily.   traZODone (DESYREL) 100 MG tablet, Take 100 mg by mouth.  Medical History:  Past  Medical History:  Diagnosis Date   Asthma    Blood transfusion without reported diagnosis    With bone surgery in 2010   GERD (gastroesophageal reflux disease)    Hypercholesterolemia    Insomnia    Mixed anxiety and depressive disorder    Allergies: Allergies[1]   Surgical History:  She  has a past surgical history that includes Bariatric Surgery; Cesarean section; Spine surgery; Abdominal hysterectomy; Tubal ligation; Wisdom tooth extraction; and Hip resection arthroplasty. Family History:  Her family history includes Asthma in her mother; Cancer - Other in her father; Colon polyps in her mother; Esophageal cancer in her father; Hypercholesterolemia in her mother; Hypertension in her mother; Other in her father.  REVIEW OF SYSTEMS  :  All other systems reviewed and negative except where noted in the History of Present Illness.  PHYSICAL EXAM: There were no vitals taken for this visit. Physical Exam          Alan JONELLE Coombs, PA-C 12:25 PM      [1]  Allergies Allergen Reactions   Latex Shortness Of Breath, Itching, Swelling and Rash    PATIENT STATES ONLY ALLERGIC TO THE POWDER INSIDE LATEX GLOVES    Penicillins Anaphylaxis, Shortness Of Breath, Swelling and Rash    THROAT SWELLING    Hydrocodone-Acetaminophen Swelling    Other reaction(s): Other (See Comments), other/intolerance HEART RACING     Methocarbamol Itching and Rash   Methylprednisolone Hives, Itching and Rash   Nsaids Other (See Comments)    Non-steroidal anti-inflammatory agent (product)- Due to weight loss surgery per patient   Oxycodone-Acetaminophen Itching, Rash and Hives   Wound Dressings Dermatitis   "

## 2024-12-20 ENCOUNTER — Ambulatory Visit: Admitting: Physician Assistant

## 2025-01-03 ENCOUNTER — Other Ambulatory Visit: Payer: Self-pay | Admitting: Gastroenterology

## 2025-01-12 NOTE — Progress Notes (Signed)
 "    01/15/2025 Rhonda Guerrero [id number] Oct 07, 1976  Referring provider: Winfred Pilsner, PA-C Primary GI doctor: Dr. San  ASSESSMENT AND PLAN:  GERD with dysphagia, history of gastric sleeve and duodenal switch, s/p dilitation that has helped dysphagia, continuing AB pain, bloating CT A/P 03/08/2022 similarly showed no acute process in the abdomen or pelvis, though there was finding of a small esophageal hiatal hernia  10/05/2024 EGD mildly torturous lower esophagus, subtle benign-appearing stricture status post dilatation 18 mm gastric sleeve with healthy mucosa erythematous mucosa duodenal switch healthy appearing mucosa normal, path negative H. pylori negative intestinal metaplasia - continue Protonix  40 mg twice daily has improved symptoms - question SIBO with symptoms, trial of flagyl  and neomycin  - Bile noted coming from pylorus query bile acid reflux, consider Carafate trial - Consider UGI with on going symptoms - 3 month follow up virtual appointment  Constipation for years improved with linzess  but difficult cost with insurance PLAN: - will try the linzess  290 mcg for 90 days, will give samples, consider amitiza 24 mcg BID if too expensive, add on miralax daily - Increase fiber/ water intake, decrease caffeine, increase activity level. -possible component of pelvis floor dysfunction with history and symptoms, consider referral to pelvic PT.   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  Recommended recall 7 years   Patient Care Team: Winfred Pilsner, PA-C as PCP - General (Physician Assistant)  HISTORY OF PRESENT ILLNESS: 49 y.o. female with a past medical history of asthma, GERD, high cholesterol, anxiety/depression, hysterectomy, C section,  bariatric surgery (gastric sleeve and duodenal switch) and others listed below presents for evaluation of GERD.   Patient last seen in the office 09/06/2024 by Lauraine Pa, PA for GERD, dysphagia and constipation.  Discussed the use of AI scribe software for clinical note transcription with the patient, who gave verbal consent to proceed.  History of Present Illness   Rhonda Guerrero is a 49 year old female with prior gastric sleeve and duodenal switch who presents for follow-up of chronic constipation and gastrointestinal symptoms post-bariatric surgery.  Constipation persists intermittently since bariatric surgery. Linzess  has been effective when taken daily, but she continues to experience occasional constipation, particularly when hydration and regular eating are disrupted. After moving on January 3rd, she has had increased difficulty maintaining hydration and regular meals. She drinks water throughout the day and sometimes coffee in the morning. Eating an apple daily, taking chlorophyll and probiotic supplements, and adhering to her medication regimen help maintain regular bowel movements. She has not taken Linzess  for the past three days due to insurance copay issues. Smooth Move peppermint tea, Senokot, and Miralax are available at home; Miralax was previously ineffective while on narcotic pain medication, which she is no longer taking. Constipation episodes can last up to six days when not on medication.  Abdominal discomfort and a sensation of fullness occur especially after eating, with occasional severe gas and stomach cramps that can wake her at night. She describes a sensation of her stomach trying to expand after the first few bites of food. Increased gas is noted, but no clear food triggers identified. No blood in stool or melena. She denies regurgitation, nausea, vomiting, or current dysphagia, and is able to tolerate a variety of foods, including spicy foods such as jerk  chicken.  Pantoprazole  twice daily has controlled her acid reflux symptoms. No recurrence of dysphagia or swallowing difficulties since esophageal stricture dilation in October. She sometimes goes most of the day without urinating if not drinking enough fluids, but denies urinary frequency or urgency.  Since surgery, she feels more susceptible to illness when exposed to others. She also experiences numbness in two fingers and both big toes, with no improvement after shaking out the hand. History of shoulder issues with prior injections. She remains out of work and on long-term disability since her surgeries, with significant financial stress due to increased insurance costs and copays.      She  reports that she has never smoked. She has never used smokeless tobacco. She reports current alcohol use. She reports that she does not use drugs.  RELEVANT GI HISTORY, IMAGING AND LABS: Results   Labs H. pylori (09/2024): Negative  Diagnostic Upper endoscopy (09/2024): Tortuous esophagus with longitudinal furrows; esophageal stricture status post dilation; gastric sleeve with normal appearance; duodenal switch with healthy mucosa; negative for H. pylori; possible bile in distal stomach; no cholelithiasis.     CT A/P 03/08/2022 1. No acute process demonstrated in the abdomen or pelvis. No evidence of bowel obstruction or inflammation. Appendix is normal. 2. Small esophageal hiatal hernia. 3. Postoperative gastric sleeve and hysterectomy. 4. Degenerative changes and postoperative changes in the lumbar spine.   Colonoscopy 10/29/2021 (done by Dr. Legrand) - One diminutive polyp at the hepatic flexure, removed with a cold snare. Resected and retrieved.  - Diverticulosis in the left colon and in the right colon.  - The examination was otherwise normal on direct and retroflexion views. 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    CT A/P 03/21/2021 1. No acute CT findings of the abdomen or pelvis to explain lower abdominal pain. 2. Status post Roux type gastric bypass. 3. Sigmoid diverticulosis without evidence of acute diverticulitis. 4. Status post hysterectomy. CBC    Component Value Date/Time   WBC 7.1 09/06/2024 0946   RBC 4.40 09/06/2024 0946   HGB 13.3 09/06/2024 0946   HCT 40.3 09/06/2024 0946   PLT 339.0 09/06/2024 0946   MCV 91.6 09/06/2024 0946   MCH 31.1 05/12/2024 1247   MCHC 33.0 09/06/2024 0946   RDW 14.2 09/06/2024 0946   LYMPHSABS 2.5 09/06/2024 0946   MONOABS 0.4 09/06/2024 0946   EOSABS 0.3 09/06/2024 0946   BASOSABS 0.0 09/06/2024 0946   Recent Labs    05/12/24 1247 09/06/24 0946  HGB 14.1 13.3    CMP     Component Value Date/Time   NA 139 09/06/2024 0946   K 4.0 09/06/2024 0946   CL 104 09/06/2024 0946   CO2 27 09/06/2024 0946   GLUCOSE 83 09/06/2024 0946   BUN 8 09/06/2024 0946   CREATININE 0.79 09/06/2024 0946   CALCIUM 8.6 09/06/2024 0946   PROT 7.1 09/06/2024 0946   ALBUMIN 4.1 09/06/2024 0946   AST 18 09/06/2024 0946   ALT 20 09/06/2024 0946   ALKPHOS 77 09/06/2024 0946   BILITOT 0.3 09/06/2024 0946   GFRNONAA >60 05/12/2024 1247      Latest Ref Rng & Units 09/06/2024    9:46 AM 03/08/2022    7:30 PM 03/12/2021    4:31 PM  Hepatic Function  Total Protein 6.0 - 8.3 g/dL 7.1  6.0  7.7   Albumin 3.5 - 5.2 g/dL 4.1  3.3  4.5  AST 0 - 37 U/L 18  18  17    ALT 0 - 35 U/L 20  37  22   Alk Phosphatase 39 - 117 U/L 77  75  86   Total Bilirubin 0.2 - 1.2 mg/dL 0.3  0.3  0.4       Current Medications:   Current Outpatient Medications (Cardiovascular):    prazosin (MINIPRESS) 2 MG capsule, Take 2 mg by mouth at bedtime. 4mg   Current Outpatient Medications (Respiratory):    albuterol  (VENTOLIN  HFA) 108 (90 Base) MCG/ACT inhaler, Inhale 2 puffs into the lungs 4 (four) times daily as needed.   diphenhydrAMINE (BENADRYL) 25 mg capsule, Take 25 mg by mouth.    ipratropium (ATROVENT) 0.06 % nasal spray, Place 2 sprays into the nose. As needed  Current Outpatient Medications (Other):    CHLOROPHYLL PO,    cyclobenzaprine (FLEXERIL) 10 MG tablet, Take 1 tablet by mouth at bedtime.   DULoxetine HCl 40 MG CPEP, Take 1 capsule by mouth 2 (two) times daily.   escitalopram (LEXAPRO) 20 MG tablet, Take 1 tablet by mouth daily.   famotidine  (PEPCID ) 20 MG tablet, Take 1 tablet (20 mg total) by mouth 2 (two) times daily. (Patient taking differently: Take 20 mg by mouth as needed.)   hydrOXYzine (ATARAX) 25 MG tablet, Take 25 mg by mouth. As needed   Lactobacillus Rhamnosus, GG, (RA PROBIOTIC DIGESTIVE CARE) CAPS, Take 1 capsule by mouth daily.   metroNIDAZOLE  (FLAGYL ) 250 MG tablet, Take 1 tablet (250 mg total) by mouth 3 (three) times daily for 10 days.   Misc Natural Products (TURMERIC, CURCUMIN, PO), Take by mouth.   Multiple Vitamins-Minerals (MULTIVITAL PO), Take by mouth.   neomycin  (MYCIFRADIN ) 500 MG tablet, Take 1 tablet (500 mg total) by mouth 2 (two) times daily for 7 days.   pantoprazole  (PROTONIX ) 40 MG tablet, Take 1 tablet (40 mg total) by mouth 2 (two) times daily.   pimecrolimus (ELIDEL) 1 % cream, As needed   pregabalin (LYRICA) 150 MG capsule, Take 150 mg by mouth 3 (three) times daily.   traZODone (DESYREL) 100 MG tablet, Take 100 mg by mouth.   Docusate Sodium (DSS) 100 MG CAPS, TAKE 1 CAPSULE EVERY DAY BY ORAL ROUTE AS DIRECTED, FOR CONSTIPATION. (Patient not taking: Reported on 01/15/2025)   linaclotide  (LINZESS ) 290 MCG CAPS capsule, Take 1 capsule (290 mcg total) by mouth daily before breakfast.  Medical History:  Past Medical History:  Diagnosis Date   Asthma    Blood transfusion without reported diagnosis    With bone surgery in 2010   GERD (gastroesophageal reflux disease)    Hypercholesterolemia    Insomnia    Mixed anxiety and depressive disorder    Allergies: Allergies[1]   Surgical History:  She  has a past surgical  history that includes Bariatric Surgery; Cesarean section; Spine surgery; Abdominal hysterectomy; Tubal ligation; Wisdom tooth extraction; and Hip resection arthroplasty. Family History:  Her family history includes Asthma in her mother; Cancer - Other in her father; Colon polyps in her mother; Esophageal cancer in her father; Hypercholesterolemia in her mother; Hypertension in her mother; Other in her father.  REVIEW OF SYSTEMS  : All other systems reviewed and negative except where noted in the History of Present Illness.  PHYSICAL EXAM: BP 120/78   Pulse 92   Ht 5' 4 (1.626 m)   Wt 197 lb (89.4 kg)   BMI 33.81 kg/m  Physical Exam   GENERAL APPEARANCE: Well nourished, in no  apparent distress. HEENT: No cervical lymphadenopathy, unremarkable thyroid, sclerae anicteric, conjunctiva pink. RESPIRATORY: Respiratory effort normal, breath sounds equal bilaterally without rales, rhonchi, or wheezing. CARDIO: Regular rate and rhythm with no murmurs, rubs, or gallops, peripheral pulses intact. ABDOMEN: Soft, non-distended, active bowel sounds in all four quadrants, no tenderness to palpation, no rebound, no mass appreciated, abdomen normal. RECTAL: Declines. MUSCULOSKELETAL: Full range of motion, normal gait, without edema. SKIN: Dry, intact without rashes or lesions. No jaundice. NEURO: Alert, oriented, no focal deficits. PSYCH: Cooperative, normal mood and affect.      Alan JONELLE Coombs, PA-C 11:19 AM      [1]  Allergies Allergen Reactions   Latex Shortness Of Breath, Itching, Swelling and Rash    PATIENT STATES ONLY ALLERGIC TO THE POWDER INSIDE LATEX GLOVES    Penicillins Anaphylaxis, Shortness Of Breath, Swelling and Rash    THROAT SWELLING    Hydrocodone-Acetaminophen Swelling    Other reaction(s): Other (See Comments), other/intolerance HEART RACING     Methocarbamol Itching and Rash   Methylprednisolone Hives, Itching and Rash   Nsaids Other (See Comments)     Non-steroidal anti-inflammatory agent (product)- Due to weight loss surgery per patient   Oxycodone-Acetaminophen Itching, Rash and Hives   Wound Dressings Dermatitis   "

## 2025-01-15 ENCOUNTER — Encounter: Payer: Self-pay | Admitting: Physician Assistant

## 2025-01-15 ENCOUNTER — Ambulatory Visit: Admitting: Physician Assistant

## 2025-01-15 VITALS — BP 120/78 | HR 92 | Ht 64.0 in | Wt 197.0 lb

## 2025-01-15 DIAGNOSIS — Z860101 Personal history of adenomatous and serrated colon polyps: Secondary | ICD-10-CM

## 2025-01-15 DIAGNOSIS — R1310 Dysphagia, unspecified: Secondary | ICD-10-CM | POA: Diagnosis not present

## 2025-01-15 DIAGNOSIS — K219 Gastro-esophageal reflux disease without esophagitis: Secondary | ICD-10-CM | POA: Diagnosis not present

## 2025-01-15 DIAGNOSIS — Z9884 Bariatric surgery status: Secondary | ICD-10-CM

## 2025-01-15 DIAGNOSIS — K2990 Gastroduodenitis, unspecified, without bleeding: Secondary | ICD-10-CM

## 2025-01-15 DIAGNOSIS — K5900 Constipation, unspecified: Secondary | ICD-10-CM

## 2025-01-15 DIAGNOSIS — R1319 Other dysphagia: Secondary | ICD-10-CM

## 2025-01-15 MED ORDER — LINACLOTIDE 290 MCG PO CAPS: 290.0000 ug | ORAL_CAPSULE | Freq: Every day | ORAL | 2 refills | Status: AC

## 2025-01-15 MED ORDER — METRONIDAZOLE 250 MG PO TABS: 250.0000 mg | ORAL_TABLET | Freq: Three times a day (TID) | ORAL | 0 refills | Status: AC

## 2025-01-15 MED ORDER — NEOMYCIN SULFATE 500 MG PO TABS: 500.0000 mg | ORAL_TABLET | Freq: Two times a day (BID) | ORAL | 0 refills | Status: AC

## 2025-01-15 NOTE — Patient Instructions (Addendum)
 Linzess  290 mcg *IBS-C patients may begin to experience relief from belly pain and overall abdominal symptoms (pain, discomfort, and bloating) in about 1 week,  with symptoms typically improving over 12 weeks.  Take at least 30 minutes before the first meal of the day on an empty stomach You can have a loose stool if you eat a high-fat breakfast. Give it at least 7 days, may have more bowel movements during that time.   The diarrhea should go away and you should start having normal, complete, full bowel movements.  It may be helpful to start treatment when you can be near the comfort of your own bathroom, such as a weekend.  After you are out we can send in a prescription if you did well, there is a prescription card   Miralax is an osmotic laxative.  It only brings more water into the stool.  This is safe to take daily.  Can take up to 17 gram of miralax twice a day.  Mix with juice or coffee.  Start 1 capful for 3-4 days and reassess your response in 3-4 days.  You can increase and decrease the dose based on your response.  Remember, it can take up to 3-4 days to take effect OR for the effects to wear off.   I often pair this with benefiber in the morning to help assure the stool is not too loose.   Toileting tips to help with your constipation - Drink at least 64-80 ounces of water/liquid per day. - Establish a time to try to move your bowels every day.  For many people, this is after a cup of coffee or after a meal such as breakfast. - Sit all of the way back on the toilet keeping your back fairly straight and while sitting up, try to rest the tops of your forearms on your upper thighs.   - Raising your feet with a step stool/squatty potty can be helpful to improve the angle that allows your stool to pass through the rectum. - Relax the rectum feeling it bulge toward the toilet water.  If you feel your rectum raising toward your body, you are contracting rather than relaxing. -  Breathe in and slowly exhale. Belly breath by expanding your belly towards your belly button. Keep belly expanded as you gently direct pressure down and back to the anus.  A low pitched GRRR sound can assist with increasing intra-abdominal pressure.  (Can also trying to blow on a pinwheel and make it move, this helps with the same belly breathing) - Repeat 3-4 times. If unsuccessful, contract the pelvic floor to restore normal tone and get off the toilet.  Avoid excessive straining. - To reduce excessive wiping by teaching your anus to normally contract, place hands on outer aspect of knees and resist knee movement outward.  Hold 5-10 second then place hands just inside of knees and resist inward movement of knees.  Hold 5 seconds.  Repeat a few times each way.  Go to the ER if unable to pass gas, severe AB pain, unable to hold down food, any shortness of breath of chest pain.  Small intestinal bacterial overgrowth (SIBO) occurs when there is an abnormal increase in the overall bacterial population in the small intestine -- particularly types of bacteria not commonly found in that part of the digestive tract. Small intestinal bacterial overgrowth (SIBO) commonly results when a circumstance -- such as surgery or disease -- slows the passage of food and waste  products in the digestive tract, creating a breeding ground for bacteria.  Signs and symptoms of SIBO often include: Loss of appetite Abdominal pain Nausea Bloating An uncomfortable feeling of fullness after eating Diarrhea or constipation, depending on the type of gas produced  What foods trigger SIBO? While foods arent the original cause of SIBO, certain foods do encourage the overgrowth of the wrong bacteria in your small intestine. If youre feeding them their favorite foods, theyre going to grow more, and that will trigger more of your SIBO symptoms. By the same token, you can help reduce the overgrowth by starving the problematic  bacteria of their favorite foods. This strategy has led to a number of proposed SIBO eating plans. The plans vary, and so do individual results. But in general, they tend to recommend limiting carbohydrates.  These include: Sugars and sweeteners. Fruits and starchy vegetables. Dairy products. Grains.  There is a test for this we can do called a breath test, if you are positive we will treat you with an antibiotic to see if it helps.  Your symptoms are very suspicious for this condition, as discussed, we will start you on an antibiotic to see if this helps.

## 2025-03-15 ENCOUNTER — Ambulatory Visit: Admitting: Physician Assistant
